# Patient Record
Sex: Female | Born: 1940 | Race: White | Hispanic: No | Marital: Single
Health system: Southern US, Community
[De-identification: ages and names within clinical notes are randomized; demographics above are authoritative.]

## PROBLEM LIST (undated history)

## (undated) DIAGNOSIS — E039 Hypothyroidism, unspecified: Secondary | ICD-10-CM

## (undated) DIAGNOSIS — R52 Pain, unspecified: Secondary | ICD-10-CM

## (undated) DIAGNOSIS — K219 Gastro-esophageal reflux disease without esophagitis: Secondary | ICD-10-CM

## (undated) DIAGNOSIS — M109 Gout, unspecified: Secondary | ICD-10-CM

## (undated) DIAGNOSIS — B953 Streptococcus pneumoniae as the cause of diseases classified elsewhere: Secondary | ICD-10-CM

## (undated) DIAGNOSIS — F3289 Other specified depressive episodes: Secondary | ICD-10-CM

## (undated) DIAGNOSIS — E569 Vitamin deficiency, unspecified: Secondary | ICD-10-CM

## (undated) DIAGNOSIS — K5900 Constipation, unspecified: Secondary | ICD-10-CM

## (undated) DIAGNOSIS — I10 Essential (primary) hypertension: Secondary | ICD-10-CM

## (undated) DIAGNOSIS — F329 Major depressive disorder, single episode, unspecified: Secondary | ICD-10-CM

## (undated) DIAGNOSIS — I2510 Atherosclerotic heart disease of native coronary artery without angina pectoris: Secondary | ICD-10-CM

## (undated) DIAGNOSIS — G608 Other hereditary and idiopathic neuropathies: Secondary | ICD-10-CM

## (undated) DIAGNOSIS — L989 Disorder of the skin and subcutaneous tissue, unspecified: Secondary | ICD-10-CM

## (undated) HISTORY — DX: Atherosclerotic heart disease of native coronary artery without angina pectoris: I25.10

## (undated) HISTORY — DX: Pain, unspecified: R52

## (undated) HISTORY — DX: Major depressive disorder, single episode, unspecified: F32.9

## (undated) HISTORY — DX: Gout, unspecified: M10.9

## (undated) HISTORY — DX: Vitamin deficiency, unspecified: E56.9

## (undated) HISTORY — DX: Streptococcus pneumoniae as the cause of diseases classified elsewhere: B95.3

## (undated) HISTORY — DX: Essential (primary) hypertension: I10

## (undated) HISTORY — DX: Other specified depressive episodes: F32.89

## (undated) HISTORY — DX: Other hereditary and idiopathic neuropathies: G60.8

## (undated) HISTORY — DX: Hypothyroidism, unspecified: E03.9

## (undated) HISTORY — DX: Disorder of the skin and subcutaneous tissue, unspecified: L98.9

## (undated) HISTORY — DX: Constipation, unspecified: K59.00

## (undated) HISTORY — DX: Gastro-esophageal reflux disease without esophagitis: K21.9

---

## 2005-01-09 ENCOUNTER — Emergency Department: Payer: Self-pay | Admitting: Emergency Medicine

## 2005-01-09 ENCOUNTER — Other Ambulatory Visit: Payer: Self-pay

## 2008-01-20 ENCOUNTER — Ambulatory Visit: Payer: Self-pay | Admitting: Nephrology

## 2008-02-22 ENCOUNTER — Observation Stay: Payer: Self-pay | Admitting: Internal Medicine

## 2008-03-13 ENCOUNTER — Ambulatory Visit: Payer: Self-pay | Admitting: Oncology

## 2008-03-20 ENCOUNTER — Ambulatory Visit: Payer: Self-pay | Admitting: Oncology

## 2008-04-13 ENCOUNTER — Ambulatory Visit: Payer: Self-pay | Admitting: Oncology

## 2008-06-28 ENCOUNTER — Ambulatory Visit: Payer: Self-pay | Admitting: Oncology

## 2008-07-13 ENCOUNTER — Ambulatory Visit: Payer: Self-pay | Admitting: Oncology

## 2008-08-09 ENCOUNTER — Ambulatory Visit: Payer: Self-pay | Admitting: Oncology

## 2008-08-13 ENCOUNTER — Ambulatory Visit: Payer: Self-pay | Admitting: Oncology

## 2010-01-16 ENCOUNTER — Ambulatory Visit: Payer: Self-pay | Admitting: Nephrology

## 2010-02-13 ENCOUNTER — Emergency Department: Payer: Self-pay | Admitting: Emergency Medicine

## 2010-05-23 ENCOUNTER — Inpatient Hospital Stay: Payer: Self-pay | Admitting: Internal Medicine

## 2010-06-24 ENCOUNTER — Ambulatory Visit: Payer: Self-pay | Admitting: Vascular Surgery

## 2010-07-10 ENCOUNTER — Ambulatory Visit: Payer: Self-pay | Admitting: Vascular Surgery

## 2010-07-18 ENCOUNTER — Ambulatory Visit: Payer: Self-pay | Admitting: Vascular Surgery

## 2010-07-21 ENCOUNTER — Inpatient Hospital Stay: Payer: Self-pay | Admitting: *Deleted

## 2010-08-08 ENCOUNTER — Ambulatory Visit: Payer: Self-pay | Admitting: Oncology

## 2010-08-14 ENCOUNTER — Ambulatory Visit: Payer: Self-pay | Admitting: Oncology

## 2010-09-14 ENCOUNTER — Ambulatory Visit: Payer: Self-pay | Admitting: Oncology

## 2010-10-21 ENCOUNTER — Ambulatory Visit: Payer: Self-pay | Admitting: Vascular Surgery

## 2010-10-24 ENCOUNTER — Ambulatory Visit: Payer: Self-pay | Admitting: Oncology

## 2010-11-21 ENCOUNTER — Ambulatory Visit: Payer: Self-pay | Admitting: Oncology

## 2010-12-07 ENCOUNTER — Emergency Department: Payer: Self-pay | Admitting: Emergency Medicine

## 2010-12-14 ENCOUNTER — Ambulatory Visit: Payer: Self-pay | Admitting: Oncology

## 2011-01-14 ENCOUNTER — Ambulatory Visit: Payer: Self-pay | Admitting: Oncology

## 2011-01-20 ENCOUNTER — Ambulatory Visit: Payer: Self-pay | Admitting: Oncology

## 2011-01-20 LAB — CANCER CENTER HEMOGLOBIN: HGB: 10.4 g/dL — ABNORMAL LOW (ref 12.0–16.0)

## 2011-02-14 ENCOUNTER — Ambulatory Visit: Payer: Self-pay | Admitting: Oncology

## 2011-04-14 ENCOUNTER — Emergency Department: Payer: Self-pay | Admitting: Emergency Medicine

## 2011-05-28 ENCOUNTER — Emergency Department: Payer: Self-pay | Admitting: Emergency Medicine

## 2011-05-28 LAB — BASIC METABOLIC PANEL
Anion Gap: 7 (ref 7–16)
BUN: 16 mg/dL (ref 7–18)
Chloride: 106 mmol/L (ref 98–107)
Creatinine: 3.01 mg/dL — ABNORMAL HIGH (ref 0.60–1.30)
Glucose: 98 mg/dL (ref 65–99)
Osmolality: 284 (ref 275–301)
Potassium: 4.2 mmol/L (ref 3.5–5.1)

## 2011-09-11 ENCOUNTER — Ambulatory Visit: Payer: Self-pay | Admitting: Family Medicine

## 2011-12-09 ENCOUNTER — Emergency Department: Payer: Self-pay | Admitting: Emergency Medicine

## 2012-04-28 ENCOUNTER — Inpatient Hospital Stay: Payer: Self-pay | Admitting: Internal Medicine

## 2012-04-28 LAB — CBC
HGB: 9.9 g/dL — ABNORMAL LOW (ref 12.0–16.0)
MCH: 34.3 pg — ABNORMAL HIGH (ref 26.0–34.0)
MCHC: 33 g/dL (ref 32.0–36.0)
MCV: 104 fL — ABNORMAL HIGH (ref 80–100)
Platelet: 279 10*3/uL (ref 150–440)
RBC: 2.89 10*6/uL — ABNORMAL LOW (ref 3.80–5.20)

## 2012-04-28 LAB — COMPREHENSIVE METABOLIC PANEL
Albumin: 3.3 g/dL — ABNORMAL LOW (ref 3.4–5.0)
Alkaline Phosphatase: 72 U/L (ref 50–136)
Bilirubin,Total: 0.7 mg/dL (ref 0.2–1.0)
Co2: 29 mmol/L (ref 21–32)
Creatinine: 4.89 mg/dL — ABNORMAL HIGH (ref 0.60–1.30)
EGFR (African American): 10 — ABNORMAL LOW
EGFR (Non-African Amer.): 8 — ABNORMAL LOW
Glucose: 100 mg/dL — ABNORMAL HIGH (ref 65–99)
Osmolality: 283 (ref 275–301)
Potassium: 3.4 mmol/L — ABNORMAL LOW (ref 3.5–5.1)
SGOT(AST): 17 U/L (ref 15–37)
Sodium: 140 mmol/L (ref 136–145)
Total Protein: 6.7 g/dL (ref 6.4–8.2)

## 2012-04-28 LAB — CK TOTAL AND CKMB (NOT AT ARMC)
CK, Total: 39 U/L (ref 21–215)
CK-MB: 0.7 ng/mL (ref 0.5–3.6)

## 2012-04-30 LAB — PHOSPHORUS: Phosphorus: 2.4 mg/dL — ABNORMAL LOW (ref 2.5–4.9)

## 2012-05-04 LAB — CULTURE, BLOOD (SINGLE)

## 2012-05-25 ENCOUNTER — Ambulatory Visit: Payer: Self-pay | Admitting: Ophthalmology

## 2012-05-25 LAB — HEMOGLOBIN: HGB: 10.4 g/dL — ABNORMAL LOW (ref 12.0–16.0)

## 2012-05-31 ENCOUNTER — Ambulatory Visit: Payer: Self-pay | Admitting: Ophthalmology

## 2012-06-27 ENCOUNTER — Observation Stay: Payer: Self-pay | Admitting: Internal Medicine

## 2012-06-27 LAB — URINALYSIS, COMPLETE
Bacteria: NONE SEEN
Bilirubin,UR: NEGATIVE
Ketone: NEGATIVE
Nitrite: NEGATIVE
Protein: 100

## 2012-06-27 LAB — PRO B NATRIURETIC PEPTIDE: B-Type Natriuretic Peptide: 12957 pg/mL — ABNORMAL HIGH (ref 0–125)

## 2012-06-27 LAB — CK TOTAL AND CKMB (NOT AT ARMC)
CK, Total: 41 U/L (ref 21–215)
CK-MB: 1.2 ng/mL (ref 0.5–3.6)

## 2012-06-27 LAB — COMPREHENSIVE METABOLIC PANEL
Albumin: 3.6 g/dL (ref 3.4–5.0)
Alkaline Phosphatase: 82 U/L (ref 50–136)
BUN: 42 mg/dL — ABNORMAL HIGH (ref 7–18)
Bilirubin,Total: 0.4 mg/dL (ref 0.2–1.0)
Calcium, Total: 8.5 mg/dL (ref 8.5–10.1)
EGFR (Non-African Amer.): 7 — ABNORMAL LOW
Glucose: 89 mg/dL (ref 65–99)
Potassium: 3.7 mmol/L (ref 3.5–5.1)
SGOT(AST): 30 U/L (ref 15–37)
SGPT (ALT): 19 U/L (ref 12–78)
Sodium: 138 mmol/L (ref 136–145)

## 2012-06-27 LAB — TROPONIN I
Troponin-I: <0.02 ng/mL
Troponin-I: <0.02 ng/mL

## 2012-06-27 LAB — CBC
HGB: 12 g/dL (ref 12.0–16.0)
MCH: 34.8 pg — ABNORMAL HIGH (ref 26.0–34.0)
MCHC: 33.2 g/dL (ref 32.0–36.0)
MCV: 105 fL — ABNORMAL HIGH (ref 80–100)
RBC: 3.46 10*6/uL — ABNORMAL LOW (ref 3.80–5.20)
RDW: 16.3 % — ABNORMAL HIGH (ref 11.5–14.5)

## 2012-06-28 LAB — CK TOTAL AND CKMB (NOT AT ARMC)
CK, Total: 48 U/L (ref 21–215)
CK-MB: 1.3 ng/mL (ref 0.5–3.6)

## 2012-06-28 LAB — RENAL FUNCTION PANEL
Anion Gap: 9 (ref 7–16)
BUN: 50 mg/dL — ABNORMAL HIGH (ref 7–18)
Co2: 28 mmol/L (ref 21–32)
Creatinine: 6.74 mg/dL — ABNORMAL HIGH (ref 0.60–1.30)
EGFR (African American): 6 — ABNORMAL LOW
Glucose: 107 mg/dL — ABNORMAL HIGH (ref 65–99)
Phosphorus: 7.7 mg/dL — ABNORMAL HIGH (ref 2.5–4.9)
Potassium: 3.6 mmol/L (ref 3.5–5.1)

## 2012-06-28 LAB — TROPONIN I: Troponin-I: <0.02 ng/mL

## 2012-07-01 ENCOUNTER — Ambulatory Visit: Payer: Self-pay | Admitting: Ophthalmology

## 2012-07-01 LAB — HEMOGLOBIN: HGB: 12.7 g/dL (ref 12.0–16.0)

## 2012-07-01 LAB — POTASSIUM: Potassium: 3.8 mmol/L (ref 3.5–5.1)

## 2012-07-05 ENCOUNTER — Ambulatory Visit: Payer: Self-pay | Admitting: Ophthalmology

## 2012-10-04 ENCOUNTER — Observation Stay: Payer: Self-pay | Admitting: Family Medicine

## 2012-10-04 LAB — CBC
MCH: 37 pg — ABNORMAL HIGH (ref 26.0–34.0)
MCHC: 34 g/dL (ref 32.0–36.0)
Platelet: 228 10*3/uL (ref 150–440)
WBC: 8.9 10*3/uL (ref 3.6–11.0)

## 2012-10-04 LAB — BASIC METABOLIC PANEL
Anion Gap: 7 (ref 7–16)
BUN: 41 mg/dL — ABNORMAL HIGH (ref 7–18)
Calcium, Total: 8.7 mg/dL (ref 8.5–10.1)
Co2: 26 mmol/L (ref 21–32)
EGFR (African American): 6 — ABNORMAL LOW
Glucose: 93 mg/dL (ref 65–99)
Potassium: 3.9 mmol/L (ref 3.5–5.1)
Sodium: 140 mmol/L (ref 136–145)

## 2012-10-04 LAB — PHOSPHORUS: Phosphorus: 6.5 mg/dL — ABNORMAL HIGH (ref 2.5–4.9)

## 2012-10-04 LAB — CK TOTAL AND CKMB (NOT AT ARMC)
CK-MB: 1.1 ng/mL (ref 0.5–3.6)
CK-MB: 1.1 ng/mL (ref 0.5–3.6)

## 2012-10-04 LAB — TROPONIN I: Troponin-I: 0.04 ng/mL

## 2012-10-05 DIAGNOSIS — R079 Chest pain, unspecified: Secondary | ICD-10-CM

## 2012-10-05 LAB — BASIC METABOLIC PANEL
Anion Gap: 6 — ABNORMAL LOW (ref 7–16)
Co2: 30 mmol/L (ref 21–32)
Creatinine: 3.17 mg/dL — ABNORMAL HIGH (ref 0.60–1.30)
EGFR (African American): 16 — ABNORMAL LOW
Potassium: 3.5 mmol/L (ref 3.5–5.1)
Sodium: 137 mmol/L (ref 136–145)

## 2012-10-05 LAB — CK TOTAL AND CKMB (NOT AT ARMC)
CK, Total: 38 U/L (ref 21–215)
CK-MB: 1 ng/mL (ref 0.5–3.6)

## 2013-02-04 ENCOUNTER — Emergency Department: Payer: Self-pay

## 2013-02-04 LAB — CBC WITH DIFFERENTIAL/PLATELET
BASOS ABS: 0.1 10*3/uL (ref 0.0–0.1)
Basophil %: 0.9 %
EOS ABS: 0.3 10*3/uL (ref 0.0–0.7)
EOS PCT: 3.5 %
HCT: 29.1 % — ABNORMAL LOW (ref 35.0–47.0)
HGB: 9.4 g/dL — ABNORMAL LOW (ref 12.0–16.0)
LYMPHS PCT: 18.2 %
Lymphocyte #: 1.5 10*3/uL (ref 1.0–3.6)
MCH: 33.9 pg (ref 26.0–34.0)
MCHC: 32.5 g/dL (ref 32.0–36.0)
MCV: 104 fL — AB (ref 80–100)
MONOS PCT: 4.7 %
Monocyte #: 0.4 x10 3/mm (ref 0.2–0.9)
NEUTROS ABS: 5.9 10*3/uL (ref 1.4–6.5)
Neutrophil %: 72.7 %
Platelet: 221 10*3/uL (ref 150–440)
RBC: 2.79 10*6/uL — ABNORMAL LOW (ref 3.80–5.20)
RDW: 17.2 % — ABNORMAL HIGH (ref 11.5–14.5)
WBC: 8.1 10*3/uL (ref 3.6–11.0)

## 2013-02-04 LAB — BASIC METABOLIC PANEL
Anion Gap: 5 — ABNORMAL LOW (ref 7–16)
BUN: 17 mg/dL (ref 7–18)
Calcium, Total: 8.1 mg/dL — ABNORMAL LOW (ref 8.5–10.1)
Chloride: 99 mmol/L (ref 98–107)
Co2: 31 mmol/L (ref 21–32)
Creatinine: 4.07 mg/dL — ABNORMAL HIGH (ref 0.60–1.30)
EGFR (African American): 12 — ABNORMAL LOW
GFR CALC NON AF AMER: 10 — AB
GLUCOSE: 118 mg/dL — AB (ref 65–99)
Osmolality: 273 (ref 275–301)
POTASSIUM: 2.9 mmol/L — AB (ref 3.5–5.1)
Sodium: 135 mmol/L — ABNORMAL LOW (ref 136–145)

## 2013-02-04 LAB — TROPONIN I: Troponin-I: <0.02 ng/mL

## 2013-02-15 ENCOUNTER — Inpatient Hospital Stay: Payer: Self-pay | Admitting: Internal Medicine

## 2013-02-15 LAB — BASIC METABOLIC PANEL
Anion Gap: 9 (ref 7–16)
BUN: 55 mg/dL — ABNORMAL HIGH (ref 7–18)
CHLORIDE: 98 mmol/L (ref 98–107)
CREATININE: 9.39 mg/dL — AB (ref 0.60–1.30)
Calcium, Total: 9 mg/dL (ref 8.5–10.1)
Co2: 24 mmol/L (ref 21–32)
EGFR (African American): 4 — ABNORMAL LOW
EGFR (Non-African Amer.): 4 — ABNORMAL LOW
Glucose: 115 mg/dL — ABNORMAL HIGH (ref 65–99)
OSMOLALITY: 279 (ref 275–301)
POTASSIUM: 4.2 mmol/L (ref 3.5–5.1)
Sodium: 131 mmol/L — ABNORMAL LOW (ref 136–145)

## 2013-02-15 LAB — TROPONIN I
TROPONIN-I: 0.08 ng/mL — AB
Troponin-I: 0.08 ng/mL — ABNORMAL HIGH
Troponin-I: 0.11 ng/mL — ABNORMAL HIGH

## 2013-02-15 LAB — PHOSPHORUS: Phosphorus: 6.7 mg/dL — ABNORMAL HIGH (ref 2.5–4.9)

## 2013-02-15 LAB — CBC
HCT: 25.7 % — AB (ref 35.0–47.0)
HGB: 8.3 g/dL — ABNORMAL LOW (ref 12.0–16.0)
MCH: 35.2 pg — ABNORMAL HIGH (ref 26.0–34.0)
MCHC: 32.4 g/dL (ref 32.0–36.0)
MCV: 108 fL — AB (ref 80–100)
Platelet: 258 10*3/uL (ref 150–440)
RBC: 2.37 10*6/uL — AB (ref 3.80–5.20)
RDW: 18.5 % — AB (ref 11.5–14.5)
WBC: 13.4 10*3/uL — ABNORMAL HIGH (ref 3.6–11.0)

## 2013-02-16 LAB — CBC WITH DIFFERENTIAL/PLATELET
Basophil #: 0 10*3/uL (ref 0.0–0.1)
Basophil %: 0.3 %
Eosinophil #: 0 10*3/uL (ref 0.0–0.7)
Eosinophil %: 0.1 %
HCT: 21.6 % — ABNORMAL LOW (ref 35.0–47.0)
HGB: 6.8 g/dL — ABNORMAL LOW (ref 12.0–16.0)
LYMPHS ABS: 1.4 10*3/uL (ref 1.0–3.6)
Lymphocyte %: 13.2 %
MCH: 34.3 pg — ABNORMAL HIGH (ref 26.0–34.0)
MCHC: 31.6 g/dL — AB (ref 32.0–36.0)
MCV: 109 fL — AB (ref 80–100)
Monocyte #: 1.1 x10 3/mm — ABNORMAL HIGH (ref 0.2–0.9)
Monocyte %: 10.2 %
Neutrophil #: 8.2 10*3/uL — ABNORMAL HIGH (ref 1.4–6.5)
Neutrophil %: 76.2 %
PLATELETS: 191 10*3/uL (ref 150–440)
RBC: 1.99 10*6/uL — ABNORMAL LOW (ref 3.80–5.20)
RDW: 18.4 % — ABNORMAL HIGH (ref 11.5–14.5)
WBC: 10.7 10*3/uL (ref 3.6–11.0)

## 2013-02-16 LAB — BASIC METABOLIC PANEL
Anion Gap: 9 (ref 7–16)
BUN: 18 mg/dL (ref 7–18)
CALCIUM: 8.6 mg/dL (ref 8.5–10.1)
Chloride: 96 mmol/L — ABNORMAL LOW (ref 98–107)
Co2: 32 mmol/L (ref 21–32)
Creatinine: 4.28 mg/dL — ABNORMAL HIGH (ref 0.60–1.30)
EGFR (Non-African Amer.): 10 — ABNORMAL LOW
GFR CALC AF AMER: 11 — AB
GLUCOSE: 72 mg/dL (ref 65–99)
OSMOLALITY: 274 (ref 275–301)
Potassium: 3.5 mmol/L (ref 3.5–5.1)
Sodium: 137 mmol/L (ref 136–145)

## 2013-02-16 LAB — HEMOGLOBIN: HGB: 8.4 g/dL — ABNORMAL LOW (ref 12.0–16.0)

## 2013-02-16 LAB — PHOSPHORUS: PHOSPHORUS: 3.8 mg/dL (ref 2.5–4.9)

## 2013-02-16 LAB — MAGNESIUM: Magnesium: 1.9 mg/dL

## 2013-02-17 LAB — CBC WITH DIFFERENTIAL/PLATELET
Basophil #: 0 10*3/uL (ref 0.0–0.1)
Basophil %: 0.4 %
EOS ABS: 0.1 10*3/uL (ref 0.0–0.7)
EOS PCT: 1 %
HCT: 25.2 % — AB (ref 35.0–47.0)
HGB: 8.4 g/dL — ABNORMAL LOW (ref 12.0–16.0)
LYMPHS PCT: 6.8 %
Lymphocyte #: 0.7 10*3/uL — ABNORMAL LOW (ref 1.0–3.6)
MCH: 35.7 pg — ABNORMAL HIGH (ref 26.0–34.0)
MCHC: 33.5 g/dL (ref 32.0–36.0)
MCV: 107 fL — ABNORMAL HIGH (ref 80–100)
Monocyte #: 1.2 x10 3/mm — ABNORMAL HIGH (ref 0.2–0.9)
Monocyte %: 11.1 %
NEUTROS PCT: 80.7 %
Neutrophil #: 8.8 10*3/uL — ABNORMAL HIGH (ref 1.4–6.5)
PLATELETS: 211 10*3/uL (ref 150–440)
RBC: 2.37 10*6/uL — ABNORMAL LOW (ref 3.80–5.20)
RDW: 19.4 % — ABNORMAL HIGH (ref 11.5–14.5)
WBC: 10.9 10*3/uL (ref 3.6–11.0)

## 2013-02-18 LAB — CBC WITH DIFFERENTIAL/PLATELET
Basophil #: 0.1 10*3/uL (ref 0.0–0.1)
Basophil %: 0.5 %
EOS ABS: 0.2 10*3/uL (ref 0.0–0.7)
Eosinophil %: 1.4 %
HCT: 26.1 % — ABNORMAL LOW (ref 35.0–47.0)
HGB: 8.7 g/dL — ABNORMAL LOW (ref 12.0–16.0)
Lymphocyte #: 0.8 10*3/uL — ABNORMAL LOW (ref 1.0–3.6)
Lymphocyte %: 7.2 %
MCH: 35.8 pg — AB (ref 26.0–34.0)
MCHC: 33.5 g/dL (ref 32.0–36.0)
MCV: 107 fL — AB (ref 80–100)
MONOS PCT: 10.6 %
Monocyte #: 1.1 x10 3/mm — ABNORMAL HIGH (ref 0.2–0.9)
Neutrophil #: 8.6 10*3/uL — ABNORMAL HIGH (ref 1.4–6.5)
Neutrophil %: 80.3 %
Platelet: 226 10*3/uL (ref 150–440)
RBC: 2.44 10*6/uL — ABNORMAL LOW (ref 3.80–5.20)
RDW: 18.5 % — ABNORMAL HIGH (ref 11.5–14.5)
WBC: 10.7 10*3/uL (ref 3.6–11.0)

## 2013-02-18 LAB — PHOSPHORUS: Phosphorus: 4.7 mg/dL (ref 2.5–4.9)

## 2013-02-20 LAB — CULTURE, BLOOD (SINGLE)

## 2013-03-01 ENCOUNTER — Encounter: Payer: Self-pay | Admitting: Podiatry

## 2013-03-11 ENCOUNTER — Ambulatory Visit: Payer: Self-pay | Admitting: Podiatry

## 2013-03-22 ENCOUNTER — Ambulatory Visit: Payer: Self-pay | Admitting: Podiatry

## 2013-03-28 ENCOUNTER — Inpatient Hospital Stay: Payer: Self-pay | Admitting: Internal Medicine

## 2013-03-28 LAB — BASIC METABOLIC PANEL
ANION GAP: 8 (ref 7–16)
BUN: 14 mg/dL (ref 7–18)
CO2: 30 mmol/L (ref 21–32)
Calcium, Total: 8.5 mg/dL (ref 8.5–10.1)
Chloride: 100 mmol/L (ref 98–107)
Creatinine: 3.42 mg/dL — ABNORMAL HIGH (ref 0.60–1.30)
EGFR (Non-African Amer.): 13 — ABNORMAL LOW
GFR CALC AF AMER: 15 — AB
Glucose: 79 mg/dL (ref 65–99)
Osmolality: 275 (ref 275–301)
Potassium: 3 mmol/L — ABNORMAL LOW (ref 3.5–5.1)
Sodium: 138 mmol/L (ref 136–145)

## 2013-03-28 LAB — TROPONIN I
TROPONIN-I: 0.13 ng/mL — AB
TROPONIN-I: 0.15 ng/mL — AB
Troponin-I: 0.14 ng/mL — ABNORMAL HIGH

## 2013-03-28 LAB — CBC
HCT: 31.7 % — AB (ref 35.0–47.0)
HGB: 9.8 g/dL — ABNORMAL LOW (ref 12.0–16.0)
MCH: 32.6 pg (ref 26.0–34.0)
MCHC: 30.9 g/dL — ABNORMAL LOW (ref 32.0–36.0)
MCV: 106 fL — AB (ref 80–100)
Platelet: 207 10*3/uL (ref 150–440)
RBC: 3.01 10*6/uL — AB (ref 3.80–5.20)
RDW: 17 % — AB (ref 11.5–14.5)
WBC: 7.1 10*3/uL (ref 3.6–11.0)

## 2013-03-28 LAB — CK-MB
CK-MB: 1.1 ng/mL (ref 0.5–3.6)
CK-MB: 1.3 ng/mL (ref 0.5–3.6)

## 2013-03-29 LAB — CBC WITH DIFFERENTIAL/PLATELET
BASOS PCT: 0.8 %
Basophil #: 0 10*3/uL (ref 0.0–0.1)
EOS ABS: 0.2 10*3/uL (ref 0.0–0.7)
Eosinophil %: 3 %
HCT: 28.6 % — AB (ref 35.0–47.0)
HGB: 9 g/dL — AB (ref 12.0–16.0)
LYMPHS ABS: 1.4 10*3/uL (ref 1.0–3.6)
Lymphocyte %: 20.5 %
MCH: 33.1 pg (ref 26.0–34.0)
MCHC: 31.4 g/dL — AB (ref 32.0–36.0)
MCV: 106 fL — AB (ref 80–100)
MONO ABS: 0.5 x10 3/mm (ref 0.2–0.9)
Monocyte %: 7.4 %
NEUTROS PCT: 68.3 %
Neutrophil #: 4.5 10*3/uL (ref 1.4–6.5)
Platelet: 170 10*3/uL (ref 150–440)
RBC: 2.71 10*6/uL — AB (ref 3.80–5.20)
RDW: 17.2 % — ABNORMAL HIGH (ref 11.5–14.5)
WBC: 6.6 10*3/uL (ref 3.6–11.0)

## 2013-03-29 LAB — BASIC METABOLIC PANEL
Anion Gap: 4 — ABNORMAL LOW (ref 7–16)
BUN: 19 mg/dL — AB (ref 7–18)
CALCIUM: 7.8 mg/dL — AB (ref 8.5–10.1)
CHLORIDE: 101 mmol/L (ref 98–107)
CO2: 30 mmol/L (ref 21–32)
Creatinine: 4.64 mg/dL — ABNORMAL HIGH (ref 0.60–1.30)
EGFR (African American): 10 — ABNORMAL LOW
GFR CALC NON AF AMER: 9 — AB
Glucose: 68 mg/dL (ref 65–99)
OSMOLALITY: 271 (ref 275–301)
Potassium: 3.2 mmol/L — ABNORMAL LOW (ref 3.5–5.1)
Sodium: 135 mmol/L — ABNORMAL LOW (ref 136–145)

## 2013-03-29 LAB — PHOSPHORUS: Phosphorus: 3.8 mg/dL (ref 2.5–4.9)

## 2013-03-29 LAB — TSH: Thyroid Stimulating Horm: 16.6 u[IU]/mL — ABNORMAL HIGH

## 2013-03-30 LAB — PHOSPHORUS: Phosphorus: 2 mg/dL — ABNORMAL LOW (ref 2.5–4.9)

## 2013-03-30 LAB — T4, FREE: Free Thyroxine: 1.36 ng/dL (ref 0.76–1.46)

## 2013-03-31 LAB — BASIC METABOLIC PANEL
ANION GAP: 5 — AB (ref 7–16)
BUN: 6 mg/dL — ABNORMAL LOW (ref 7–18)
Calcium, Total: 8.7 mg/dL (ref 8.5–10.1)
Chloride: 100 mmol/L (ref 98–107)
Co2: 34 mmol/L — ABNORMAL HIGH (ref 21–32)
Creatinine: 2.49 mg/dL — ABNORMAL HIGH (ref 0.60–1.30)
EGFR (African American): 21 — ABNORMAL LOW
GFR CALC NON AF AMER: 19 — AB
GLUCOSE: 79 mg/dL (ref 65–99)
Osmolality: 274 (ref 275–301)
POTASSIUM: 3.1 mmol/L — AB (ref 3.5–5.1)
SODIUM: 139 mmol/L (ref 136–145)

## 2013-04-01 LAB — BASIC METABOLIC PANEL
Anion Gap: 3 — ABNORMAL LOW (ref 7–16)
BUN: 14 mg/dL (ref 7–18)
CREATININE: 4.37 mg/dL — AB (ref 0.60–1.30)
Calcium, Total: 8.6 mg/dL (ref 8.5–10.1)
Chloride: 101 mmol/L (ref 98–107)
Co2: 35 mmol/L — ABNORMAL HIGH (ref 21–32)
EGFR (African American): 11 — ABNORMAL LOW
GFR CALC NON AF AMER: 9 — AB
GLUCOSE: 86 mg/dL (ref 65–99)
Osmolality: 277 (ref 275–301)
Potassium: 3.4 mmol/L — ABNORMAL LOW (ref 3.5–5.1)
Sodium: 139 mmol/L (ref 136–145)

## 2013-04-01 LAB — PHOSPHORUS: Phosphorus: 2.2 mg/dL — ABNORMAL LOW (ref 2.5–4.9)

## 2013-04-02 LAB — CULTURE, BLOOD (SINGLE)

## 2013-04-05 ENCOUNTER — Ambulatory Visit: Payer: Self-pay | Admitting: Podiatry

## 2013-05-03 ENCOUNTER — Inpatient Hospital Stay: Payer: Self-pay | Admitting: Internal Medicine

## 2013-05-03 LAB — URINALYSIS, COMPLETE
Bacteria: NONE SEEN
Bilirubin,UR: NEGATIVE
KETONE: NEGATIVE
LEUKOCYTE ESTERASE: NEGATIVE
Nitrite: NEGATIVE
Ph: 7 (ref 4.5–8.0)
Protein: >=500
RBC,UR: 25 /HPF (ref 0–5)
SPECIFIC GRAVITY: 1.01 (ref 1.003–1.030)
Squamous Epithelial: <1
WBC UR: 2 /HPF (ref 0–5)

## 2013-05-03 LAB — PROTIME-INR
INR: 1
Prothrombin Time: 13.1 secs (ref 11.5–14.7)

## 2013-05-03 LAB — COMPREHENSIVE METABOLIC PANEL
ALK PHOS: 108 U/L
ANION GAP: 9 (ref 7–16)
AST: 20 U/L (ref 15–37)
Albumin: 2.9 g/dL — ABNORMAL LOW (ref 3.4–5.0)
BILIRUBIN TOTAL: 0.4 mg/dL (ref 0.2–1.0)
BUN: 43 mg/dL — ABNORMAL HIGH (ref 7–18)
CALCIUM: 8.5 mg/dL (ref 8.5–10.1)
CHLORIDE: 102 mmol/L (ref 98–107)
Co2: 30 mmol/L (ref 21–32)
Creatinine: 5.21 mg/dL — ABNORMAL HIGH (ref 0.60–1.30)
EGFR (African American): 9 — ABNORMAL LOW
GFR CALC NON AF AMER: 8 — AB
Glucose: 100 mg/dL — ABNORMAL HIGH (ref 65–99)
Osmolality: 292 (ref 275–301)
POTASSIUM: 4 mmol/L (ref 3.5–5.1)
SGPT (ALT): 16 U/L (ref 12–78)
SODIUM: 141 mmol/L (ref 136–145)
Total Protein: 6.2 g/dL — ABNORMAL LOW (ref 6.4–8.2)

## 2013-05-03 LAB — CBC
HCT: 32.7 % — ABNORMAL LOW
HGB: 10.3 g/dL — ABNORMAL LOW
MCH: 33.1 pg
MCHC: 31.6 g/dL — ABNORMAL LOW
MCV: 105 fL — ABNORMAL HIGH
Platelet: 242 10*3/uL
RBC: 3.12 X10 6/mm 3 — ABNORMAL LOW
RDW: 17.2 % — ABNORMAL HIGH
WBC: 10.7 10*3/uL

## 2013-05-04 LAB — MAGNESIUM: Magnesium: 1.8 mg/dL

## 2013-05-04 LAB — CBC WITH DIFFERENTIAL/PLATELET
BASOS PCT: 0.9 %
Basophil #: 0.1 10*3/uL (ref 0.0–0.1)
Eosinophil #: 0.4 10*3/uL (ref 0.0–0.7)
Eosinophil %: 5.2 %
HCT: 30.6 % — AB (ref 35.0–47.0)
HGB: 9.8 g/dL — ABNORMAL LOW (ref 12.0–16.0)
LYMPHS ABS: 1.6 10*3/uL (ref 1.0–3.6)
LYMPHS PCT: 22.1 %
MCH: 33.8 pg (ref 26.0–34.0)
MCHC: 31.9 g/dL — ABNORMAL LOW (ref 32.0–36.0)
MCV: 106 fL — ABNORMAL HIGH (ref 80–100)
MONO ABS: 0.6 x10 3/mm (ref 0.2–0.9)
Monocyte %: 9.1 %
Neutrophil #: 4.4 10*3/uL (ref 1.4–6.5)
Neutrophil %: 62.7 %
Platelet: 193 10*3/uL (ref 150–440)
RBC: 2.89 10*6/uL — ABNORMAL LOW (ref 3.80–5.20)
RDW: 17.3 % — ABNORMAL HIGH (ref 11.5–14.5)
WBC: 7.1 10*3/uL (ref 3.6–11.0)

## 2013-05-04 LAB — BASIC METABOLIC PANEL
Anion Gap: 5 — ABNORMAL LOW (ref 7–16)
BUN: 49 mg/dL — ABNORMAL HIGH (ref 7–18)
CHLORIDE: 103 mmol/L (ref 98–107)
CREATININE: 5.91 mg/dL — AB (ref 0.60–1.30)
Calcium, Total: 8.4 mg/dL — ABNORMAL LOW (ref 8.5–10.1)
Co2: 29 mmol/L (ref 21–32)
EGFR (African American): 8 — ABNORMAL LOW
EGFR (Non-African Amer.): 7 — ABNORMAL LOW
Glucose: 88 mg/dL (ref 65–99)
Osmolality: 286 (ref 275–301)
Potassium: 4.2 mmol/L (ref 3.5–5.1)
SODIUM: 137 mmol/L (ref 136–145)

## 2013-12-16 ENCOUNTER — Emergency Department: Payer: Self-pay | Admitting: Emergency Medicine

## 2013-12-16 LAB — CBC WITH DIFFERENTIAL/PLATELET
BASOS ABS: 0.1 10*3/uL (ref 0.0–0.1)
Basophil %: 1.1 %
Eosinophil #: 0.5 10*3/uL (ref 0.0–0.7)
Eosinophil %: 6.2 %
HCT: 36.7 % (ref 35.0–47.0)
HGB: 11.5 g/dL — AB (ref 12.0–16.0)
LYMPHS ABS: 1.4 10*3/uL (ref 1.0–3.6)
Lymphocyte %: 17.3 %
MCH: 30.9 pg (ref 26.0–34.0)
MCHC: 31.3 g/dL — AB (ref 32.0–36.0)
MCV: 99 fL (ref 80–100)
Monocyte #: 0.7 x10 3/mm (ref 0.2–0.9)
Monocyte %: 8.3 %
Neutrophil #: 5.4 10*3/uL (ref 1.4–6.5)
Neutrophil %: 67.1 %
PLATELETS: 275 10*3/uL (ref 150–440)
RBC: 3.72 10*6/uL — ABNORMAL LOW (ref 3.80–5.20)
RDW: 20 % — ABNORMAL HIGH (ref 11.5–14.5)
WBC: 8.1 10*3/uL (ref 3.6–11.0)

## 2013-12-16 LAB — TROPONIN I: TROPONIN-I: 0.04 ng/mL

## 2013-12-16 LAB — BASIC METABOLIC PANEL
ANION GAP: 11 (ref 7–16)
BUN: 21 mg/dL — AB (ref 7–18)
Calcium, Total: 8.4 mg/dL — ABNORMAL LOW (ref 8.5–10.1)
Chloride: 100 mmol/L (ref 98–107)
Co2: 28 mmol/L (ref 21–32)
Creatinine: 5.75 mg/dL — ABNORMAL HIGH (ref 0.60–1.30)
EGFR (African American): 9 — ABNORMAL LOW
GFR CALC NON AF AMER: 8 — AB
GLUCOSE: 118 mg/dL — AB (ref 65–99)
Osmolality: 282 (ref 275–301)
Potassium: 3.4 mmol/L — ABNORMAL LOW (ref 3.5–5.1)
Sodium: 139 mmol/L (ref 136–145)

## 2014-01-13 ENCOUNTER — Ambulatory Visit: Payer: Self-pay | Admitting: Internal Medicine

## 2014-02-01 ENCOUNTER — Ambulatory Visit: Payer: Self-pay | Admitting: Family Medicine

## 2014-02-01 LAB — CBC WITH DIFFERENTIAL/PLATELET
BASOS ABS: 0.1 10*3/uL (ref 0.0–0.1)
BASOS PCT: 1 %
EOS ABS: 0.4 10*3/uL (ref 0.0–0.7)
Eosinophil %: 7.4 %
HCT: 33.7 % — ABNORMAL LOW (ref 35.0–47.0)
HGB: 10.9 g/dL — ABNORMAL LOW (ref 12.0–16.0)
LYMPHS ABS: 1.6 10*3/uL (ref 1.0–3.6)
LYMPHS PCT: 28.8 %
MCH: 32 pg (ref 26.0–34.0)
MCHC: 32.4 g/dL (ref 32.0–36.0)
MCV: 99 fL (ref 80–100)
Monocyte #: 0.6 x10 3/mm (ref 0.2–0.9)
Monocyte %: 11.4 %
Neutrophil #: 2.8 10*3/uL (ref 1.4–6.5)
Neutrophil %: 51.4 %
Platelet: 179 10*3/uL (ref 150–440)
RBC: 3.41 10*6/uL — ABNORMAL LOW (ref 3.80–5.20)
RDW: 16.8 % — AB (ref 11.5–14.5)
WBC: 5.4 10*3/uL (ref 3.6–11.0)

## 2014-02-01 LAB — URINALYSIS, COMPLETE
BILIRUBIN, UR: NEGATIVE
Bacteria: NONE SEEN
Glucose,UR: NEGATIVE mg/dL (ref 0–75)
Hyaline Cast: 3
KETONE: NEGATIVE
Nitrite: NEGATIVE
Ph: 8 (ref 4.5–8.0)
RBC,UR: 9 /HPF (ref 0–5)
Specific Gravity: 1.012 (ref 1.003–1.030)
WBC UR: 155 /HPF (ref 0–5)

## 2014-02-01 LAB — COMPREHENSIVE METABOLIC PANEL
ALBUMIN: 2.9 g/dL — AB (ref 3.4–5.0)
Alkaline Phosphatase: 100 U/L
Anion Gap: 7 (ref 7–16)
BUN: 48 mg/dL — AB (ref 7–18)
Bilirubin,Total: 0.3 mg/dL (ref 0.2–1.0)
CALCIUM: 8.7 mg/dL (ref 8.5–10.1)
CHLORIDE: 102 mmol/L (ref 98–107)
Co2: 33 mmol/L — ABNORMAL HIGH (ref 21–32)
Creatinine: 6.58 mg/dL — ABNORMAL HIGH (ref 0.60–1.30)
EGFR (African American): 8 — ABNORMAL LOW
GFR CALC NON AF AMER: 7 — AB
GLUCOSE: 97 mg/dL (ref 65–99)
OSMOLALITY: 296 (ref 275–301)
Potassium: 5 mmol/L (ref 3.5–5.1)
SGOT(AST): 22 U/L (ref 15–37)
SGPT (ALT): 21 U/L
SODIUM: 142 mmol/L (ref 136–145)
Total Protein: 6 g/dL — ABNORMAL LOW (ref 6.4–8.2)

## 2014-02-02 ENCOUNTER — Inpatient Hospital Stay: Payer: Self-pay | Admitting: Internal Medicine

## 2014-02-02 LAB — CBC WITH DIFFERENTIAL/PLATELET
Basophil #: 0.1 10*3/uL (ref 0.0–0.1)
Basophil %: 0.7 %
Eosinophil #: 0.2 10*3/uL (ref 0.0–0.7)
Eosinophil %: 2.3 %
HCT: 38 % (ref 35.0–47.0)
HGB: 11.9 g/dL — ABNORMAL LOW (ref 12.0–16.0)
Lymphocyte #: 1.3 10*3/uL (ref 1.0–3.6)
Lymphocyte %: 14.2 %
MCH: 31.5 pg (ref 26.0–34.0)
MCHC: 31.4 g/dL — AB (ref 32.0–36.0)
MCV: 101 fL — ABNORMAL HIGH (ref 80–100)
MONO ABS: 0.6 x10 3/mm (ref 0.2–0.9)
MONOS PCT: 6.8 %
NEUTROS ABS: 6.8 10*3/uL — AB (ref 1.4–6.5)
Neutrophil %: 76 %
PLATELETS: 212 10*3/uL (ref 150–440)
RBC: 3.78 10*6/uL — ABNORMAL LOW (ref 3.80–5.20)
RDW: 17.2 % — ABNORMAL HIGH (ref 11.5–14.5)
WBC: 8.9 10*3/uL (ref 3.6–11.0)

## 2014-02-02 LAB — URINALYSIS, COMPLETE
Bilirubin,UR: NEGATIVE
GLUCOSE, UR: NEGATIVE mg/dL (ref 0–75)
KETONE: NEGATIVE
Nitrite: NEGATIVE
PH: 7 (ref 4.5–8.0)
Protein: 100
RBC,UR: 235 /HPF (ref 0–5)
SPECIFIC GRAVITY: 1.016 (ref 1.003–1.030)
WBC UR: 14870 /HPF (ref 0–5)

## 2014-02-02 LAB — COMPREHENSIVE METABOLIC PANEL
ALK PHOS: 117 U/L — AB
Albumin: 3.1 g/dL — ABNORMAL LOW (ref 3.4–5.0)
Anion Gap: 10 (ref 7–16)
BUN: 60 mg/dL — AB (ref 7–18)
Bilirubin,Total: 0.4 mg/dL (ref 0.2–1.0)
CO2: 28 mmol/L (ref 21–32)
Calcium, Total: 8.9 mg/dL (ref 8.5–10.1)
Chloride: 103 mmol/L (ref 98–107)
Creatinine: 8.09 mg/dL — ABNORMAL HIGH (ref 0.60–1.30)
EGFR (African American): 6 — ABNORMAL LOW
GFR CALC NON AF AMER: 5 — AB
GLUCOSE: 95 mg/dL (ref 65–99)
Osmolality: 298 (ref 275–301)
Potassium: 5.7 mmol/L — ABNORMAL HIGH (ref 3.5–5.1)
SGOT(AST): 46 U/L — ABNORMAL HIGH (ref 15–37)
SGPT (ALT): 25 U/L
Sodium: 141 mmol/L (ref 136–145)
TOTAL PROTEIN: 6.6 g/dL (ref 6.4–8.2)

## 2014-02-02 LAB — TROPONIN I: Troponin-I: 0.06 ng/mL — ABNORMAL HIGH

## 2014-02-02 LAB — MAGNESIUM: Magnesium: 2.3 mg/dL

## 2014-02-02 LAB — LIPASE, BLOOD: Lipase: 111 U/L (ref 73–393)

## 2014-02-03 LAB — URINE CULTURE

## 2014-02-04 LAB — URINE CULTURE

## 2014-02-07 LAB — CULTURE, BLOOD (SINGLE)

## 2014-02-13 ENCOUNTER — Ambulatory Visit: Payer: Self-pay | Admitting: Internal Medicine

## 2014-02-13 DEATH — deceased

## 2014-05-05 NOTE — Discharge Summary (Signed)
PATIENT NAME:  Jasmine Murphy, BENCH MR#:  [id number] DATE OF BIRTH:  12/15/1940  DATE OF ADMISSION:  10/04/2012 DATE OF DISCHARGE:  10/05/2012  REASON FOR ADMISSION: Chest pain.   DISCHARGE DIAGNOSES: 1.  Atypical chest pain likely secondary to either gastroesophageal reflux disease or musculoskeletal.  2.  End-stage renal disease.  3.  History of prior cerebrovascular accident.  4.  History of gout.  5.  Migraine headaches.   DISPOSITION: Home.   FOLLOWUPS: Referral to see [facility] as the patient had seen them before. She would like to get established there as she does not have a primary care physician. Her primary care physician is actually her kidney doctor, Dr. Thedore Mins.   MEDICATIONS AT DISCHARGE:  1.  Aspirin 81 mg daily.  2.  Nifedipine 60 mg take 1/2 tablet once a day.  3.  Gabapentin 300 mg 3 times daily.  4.  Labetalol 200 mg once a day.  5.  Citalopram 20 mg once a day.  6.  Levothyroxine 112 mcg once a day.  7.  Omeprazole 40 mg once a day.  8.  Furosemide 80 mg once a day. 9.  Losartan 100 mg at bedtime.  10.  Vitamin D3, 2000 units once a day.   LABORATORY AND DIAGNOSTIC RESULTS:   A nuclear stress test Lexiscan shows no significant ischemia, no significant wall motion abnormalities, ejection fraction 56%.  Left ventricular global function was normal. No concerns for ischemia on the EKG. No artifact. This is a low-risk scan.  Her creatinine was ok . Her calcium is 3.3. Other electrolytes are within normal limits. Her hemoglobin is 9.7, which is chronic disease anemia due to chronic kidney disease, end-stage renal disease. White count was 9.8, platelet count 228. Troponins were 0.02 on admission, 0.04, 0.04 second and third, respectively.  Chest x-ray did not show any signs of CHF, fluid overload or pneumonia.   HOSPITAL COURSE: A 74 year old female with history of hypertension, end-stage renal disease, CVA, came with midsternal chest pain radiating into both  shoulders that happened at rest in the middle of the night and woke her up. She had 1-1/2 hours where she was complaining of this pain and it then went away. She had nausea and she had shortness of breath.  Again, the pain went away, but in the morning she was concerned and she came to the ER. She has received 1 aspirin on the EMS, and the pain was gone during the time of this hospitalization. The patient was admitted for evaluation of this.  Her chest pain, since she had significant factors with cardiac enzymes that were not significantly changing, she had a chest x-ray that did not show any major abnormalities.  She had a stress test that was low risk. She had a couple of episodes of decreased oxygen saturation in the 90s and increased heart rate of 93. There was no major concern of PE as she was not having any significant pain anymore and the hypoxia was minimal. The patient underwent her testing, did not have any more pain during her hospitalization. We had a long talk about the evaluation of the chest pain that could be related to GERD. The patient was not taking her PPI medication on a regular basis. We recommended to have her now take it daily. The patient also was concerned about this having a heart attack, so we recommended followup with [facility] as she has been seen before by then. Her blood pressure was  stable. The patient is scheduled to continue her hemodialysis on the same Monday, Wednesday and Friday days. The patient is discharged in good condition.   TIME SPENT:   About 40 minutes with this discharge.   ____________________________ Felipa Furnaceoberto Sanchez Gutierrez, MD rsg:cb D: 10/06/2012 14:18:37 ET T: 10/06/2012 17:55:02 ET JOB#: [id number]  cc: Felipa Furnaceoberto Sanchez Gutierrez, MD, <Dictator> Mosetta PigeonHarmeet Singh, MD Laurier NancyShaukat A. Khan, MD Regan RakersOBERTO Juanda ChanceSANCHEZ GUTIERRE MD ELECTRONICALLY SIGNED 10/07/2012 11:07

## 2014-05-05 NOTE — Op Note (Signed)
PATIENT NAME:  Jasmine Murphy, Jasmine Murphy MR#:  [id number] DATE OF BIRTH:  09-29-1940  DATE OF PROCEDURE:  05/31/2012  PREOPERATIVE DIAGNOSIS:  Cataract, left eye.    POSTOPERATIVE DIAGNOSIS:  Cataract, left eye.  PROCEDURE PERFORMED:  Extracapsular cataract extraction using phacoemulsification with placement of an Alcon SN6CWS, 20.5-diopter posterior chamber lens, serial #[id number].  SURGEON:  Maylon PeppersSteven A. Imojean Yoshino, MD  ASSISTANT:  None.  ANESTHESIA:  4% lidocaine and 0.75% Marcaine in a 50/50 mixture with 10 units/mL of Hylenex added, given as a peribulbar.   ANESTHESIOLOGIST:  Dr. Dimple Caseyice.  COMPLICATIONS:  None.  ESTIMATED BLOOD LOSS:  Less than 1 ml.  DESCRIPTION OF PROCEDURE:  The patient was brought to the operating room and given a peribulbar block.  The patient was then prepped and draped in the usual fashion.  The vertical rectus muscles were imbricated using 5-0 silk sutures.  These sutures were then clamped to the sterile drapes as bridle sutures.  A limbal peritomy was performed extending two clock hours and hemostasis was obtained with cautery.  A partial thickness scleral groove was made at the surgical limbus and dissected anteriorly in a lamellar dissection using an Alcon crescent knife.  The anterior chamber was entered supero-temporally with a Superblade and through the lamellar dissection with a 2.6 mm keratome.  DisCoVisc was used to replace the aqueous and a continuous tear capsulorrhexis was carried out.  Hydrodissection and hydrodelineation were carried out with balanced salt and a 27 gauge canula.  The nucleus was rotated to confirm the effectiveness of the hydrodissection.  Phacoemulsification was carried out using a divide-and-conquer technique.  Total ultrasound time was 2 minutes and 5 seconds with an average power of 25.7 percent and CDE of 56.83.  Irrigation/aspiration was used to remove the residual cortex.  DisCoVisc was used to inflate the capsule and the internal  incision was enlarged to 3 mm with the crescent knife.  The intraocular lens was folded and inserted into the capsular bag using the AcrySert delivery system. Irrigation/aspiration was used to remove the residual DisCoVisc.  Miostat was injected into the anterior chamber through the paracentesis track to inflate the anterior chamber and induce miosis.  The wound was checked for leaks and none were found. The conjunctiva was closed with cautery and the bridle sutures were removed.  Two drops of 0.3% Vigamox were placed on the eye.   An eye shield was placed on the eye.  The patient was discharged to the recovery room in good condition. ____________________________ Maylon PeppersSteven A. Brick Ketcher, MD sad:sb D: 05/31/2012 14:08:35 ET T: 05/31/2012 15:45:29 ET JOB#: [id number]  cc: Viviann SpareSteven A. Tenicia Gural, MD, <Dictator> Erline LevineSTEVEN A Desi Rowe MD ELECTRONICALLY SIGNED 06/14/2012 13:56

## 2014-05-05 NOTE — H&P (Signed)
PATIENT NAME:  Jasmine Murphy, Jasmine Murphy MR#:  [id number] DATE OF BIRTH:  Aug 21, 1940  DATE OF ADMISSION:  04/28/2012  ADMITTING PHYSICIAN: Enid Baasadhika Malkie Wille, MD  PRIMARY CARE PHYSICIAN: Nira ConnAriana Pancaldo, MD  PRIMARY NEPHROLOGIST: Lennox PippinsMunsoor N. Lateef, MD  CHIEF COMPLAINT: Difficulty breathing.   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 The patient had her last dialysis done on Monday and is due for dialysis today. She also had pneumonia 2 weeks ago, for which she was started on an antibiotic, which she did not finish off the course as she felt better. She has been having worsening dry cough, no sputum production, no fevers or chills at this time. Per Dr. Cherylann RatelLateef, the patient is sometimes noncompliant with her medications and diet. Chest x-ray in the ER revealed pulmonary edema and her blood pressure systolic was greater than 200, so she is being admitted for pulmonary edema and also malignant hypertension.   PAST MEDICAL HISTORY:  1.  History of TIA.  2.  History of intracerebral hemorrhage.  3.  Uncontrolled hypertension.  4.  Gout.  5.  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  6.  Secondary hyperparathyroidism. 7.  Hyperlipidemia.  8.  Anemia of chronic disease.   PAST SURGICAL HISTORY:  1.  Partial hysterectomy.  2.  Cholecystectomy.  3.  AV fistula placement on right forearm.   ALLERGIES TO MEDICATIONS: THE PATIENT IS ALLERGIC TO MULTIPLE MEDICATIONS, INCLUDING CODEINE, COREG, HYDRALAZINE, NORVASC, PENICILLIN, VERAPAMIL.   HOME MEDICATIONS:  1.  Allopurinol 300 mg p.o. daily.  2.  Celexa 20 mg p.o.  daily.  3.  Vitamin D 1 tablet p.o. daily.  4.  Furosemide 80 mg on nondialysis days on Tuesday, Thursday, Saturday and Sunday.  5.  Gabapentin 600 mg p.o. t.i.d.  6.  Labetalol 200 mg p.o. b.i.d.  7.  Levothyroxine 112 mcg p.o. daily.  8.  Losartan 100 mg p.o. daily.  9.  Omeprazole 40 mg p.o. daily.  10.  Vitamin B12, 1000 mcg p.o. daily.  11.  Vitamin D3, 2000 international units p.o. daily.   SOCIAL HISTORY: Lives at home with her daughter. No smoking. Occasional alcohol use.   FAMILY HISTORY: Significant for hypertension in the family.   REVIEW OF SYSTEMS:    CONSTITUTIONAL: No fever, fatigue or weakness.  EYES: No blurred vision, double vision, glaucoma or cataracts.  EARS, NOSE, THROAT: No tinnitus, ear pain, hearing loss, epistaxis or discharge.  RESPIRATORY: Positive for dyspnea and also cough. No wheeze or hemoptysis.  CARDIOVASCULAR: No chest pain. Positive for orthopnea and PND. No pedal edema. No syncope or arrhythmias at this time.  GASTROINTESTINAL: No nausea, vomiting, diarrhea, abdominal pain, hematemesis or melena.  GENITOURINARY: The patient makes minimal urine, as she is a dialysis patient. No dysuria noted.  ENDOCRINE: No polyuria, nocturia, thyroid problems, heat or cold intolerance.  HEMATOLOGIC: Positive for anemia. No easy bruising or bleeding.  SKIN: No acne, rash or lesions.  MUSCULOSKELETAL: No neck, back, shoulder pain, arthritis or gout.  NEUROLOGIC: No numbness, weakness. History of TIA and hemorrhagic CVA.  PSYCHIATRIC: No anxiety, insomnia or depression.   PHYSICAL EXAMINATION:  VITAL SIGNS: Temperature 96.3 degrees Fahrenheit, pulse 90, respirations 24, blood pressure 205/77, pulse oximetry 95% on  2 liters oxygen.   GENERAL: Well-built, well-nourished female sitting in bed in mild respiratory distress on minimal exertion.  HEENT: Normocephalic, atraumatic. Pupils equal, round, reacting to light. Anicteric sclerae. Extraocular movements intact.  Oropharynx clear, without erythema, mass or exudates.  NECK: Supple. No thyromegaly, JVD or carotid bruits. No lymphadenopathy.  LUNGS: Moving air bilaterally. She has coarse crackles in the lower two thirds posteriorly and also anteriorly. No wheeze. Minimal use of accessory muscles for breathing and on minimal exertion.  CARDIOVASCULAR: S1, S2, regular rate and rhythm, III/VI systolic murmur present. No rubs or gallops.  ABDOMEN: Soft, nontender, nondistended. No hepatosplenomegaly. Normal bowel sounds.  EXTREMITIES: She does have 3+ pedal edema, no clubbing or cyanosis, 2+ dorsalis pedis pulses palpable bilaterally.  SKIN: No acne, rash or lesions.  LYMPHATIC: No cervical lymphadenopathy.  NEUROLOGICAL: Cranial nerves intact. No focal motor or sensory deficits.  PSYCHOLOGICAL: The patient is awake, alert, oriented x 3.   LABORATORY AND RADIOLOGICAL DATA: WBC 9.9, hemoglobin 9.9, hematocrit 30.0, platelet count 279. Sodium 140, potassium 3.4, chloride 103, bicarbonate 29, BUN 23, creatinine 4.89, glucose 100, calcium 8.2. ALT 13, AST 17, alkaline phosphatase 72, total bilirubin 0.7, albumin 3.3. CK 39, CK-MB 0.7. BNP is elevated at 40,000. Troponin is 0.02. Chest x-ray revealing interstitial infiltrate, either infectious infiltrate versus pulmonary edema.   ASSESSMENT AND PLAN: A 74 year old female with hypertension, end-stage renal disease on hemodialysis, history of prior cerebrovascular accident, admitted for pulmonary edema and also malignant hypertension.  1.  Acute pulmonary edema with respiratory distress, due for dialysis today. Will be dialyzed today and seen by nephrology. Continue Lasix on nondialysis days. Also, recent pneumonia and has been noncompliant with her antibiotic treatment. Chest x-ray revealing nonedematous infiltrate cannot be ruled out, so will start on Rocephin and azithromycin and blood cultures have been ordered.  2.  Malignant hypertension: Will need dialysis now.  Continue intravenous hydralazine as needed, and also on labetalol and Cozaar at this time. Add medications as needed; the patient is intolerant to several blood pressure medications.  3.  End-stage renal disease, on Monday, Wednesday, Friday hemodialysis: Diagnosed to be secondary to fibrillary glomerulonephritis after renal biopsy. Has been on dialysis for the past year. Has right arm fistula, which is functioning properly. Nephrology has been consulted for dialysis.  4.  Transient ischemic attack and history of intracranial hemorrhage: Appears stable now, no residual neurological deficits, so avoid antiplatelets and anticoagulants at this time.  5.  Hypothyroidism. Continue Synthroid.  6.  Gastrointestinal and deep vein thrombosis prophylaxis. On Prilosec and TED stockings.  CODE STATUS: Full code.  TIME SPENT ON ADMISSION: 50 minutes.   ____________________________ Enid Baas, MD rk:jm D: 04/28/2012 14:46:42 ET T: 04/28/2012 15:06:26 ET JOB#: [id number]  cc: Enid Baas, MD, <Dictator> Munsoor Lizabeth Leyden, MD Nira Conn, MD Enid Baas MD ELECTRONICALLY SIGNED 04/30/2012 15:56

## 2014-05-05 NOTE — Op Note (Signed)
PATIENT NAME:  Jasmine SantiagoSMITH, Jami F MR#:  [id number] DATE OF BIRTH:  1940/12/21  DATE OF PROCEDURE:  07/05/2012  PREOPERATIVE DIAGNOSIS: Cataract right eye.   POSTOPERATIVE DIAGNOSIS:  Cataract right eye.   PROCEDURE PERFORMED: Extracapsular cataract extraction using phacoemulsification with placement Alcon SN6CWS, 21.0 diopter posterior chamber lens, serial number [id number].   SURGEON: Maylon PeppersSteven A. Libbie Bartley, M.D.   ANESTHESIA: 4% lidocaine and 0.75% Marcaine a 50-50 mixture with 10 units/mL of Hylenex  added, given as a peribulbar.   ANESTHESIOLOGIST: Dr. Maisie Fushomas.   COMPLICATIONS: Tear in the posterior capsule.   ESTIMATED BLOOD LOSS:   Less than 1 mL.   DESCRIPTION OF PROCEDURE: The patient was brought to the operating room and given a peribulbar block. She was then prepped and draped in the usual fashion. The vertical rectus muscles were imbricated using 5-0 silk sutures, bridle sutures. A limbal peritomy was carried out for one clock hour and hemostasis was obtained with cautery. A partial thickness scleral groove was made at the posterior surgical limbus and dissected anteriorly into clear cornea with an Alcon crescent knife. The anterior chamber was entered superonasally through clear cornea with a paracentesis knife and through the lamellar dissection with a 2.6 mm keratome. DisCoVisc was used to place the aqueous. A continuous tear circular capsulorrhexis was started. As it was coming around to approximately the level 11 o'clock, the capsulorrhexis tore out towards the periphery. There was some positive pressure at that point. A Vannas scissor was used to complete capsulorrhexis. Hydrodissection was used to loosen the nucleus and phacoemulsification was carried out in a divide and conquer technique. Ultrasound time was 1 minute and 55 seconds with an average power of 20.7% and a CDE 47.06. Irrigation-aspiration was used to remove the residual cortex. At that point, there appeared to be rent in  the posterior capsule running almost the length of the capsule. However, no vitreous presented to the wound. Decision was made to place the intraocular lens into the capsular bag despite the rent. DisCoVisc was used to inflate the bag and the lens was inserted using a [employer] shooter. It was rotated to try to keep the haptic within the bag and away from the rent. Irrigation-aspiration was used to remove the residual DisCoVisc. There was some positive pressure at that point. And the 10-0 nylon suture was placed across the wound. Balanced salt was used to inflate the wound and Miostat was then injected into the paracentesis tract. The lens appeared to stay in good position throughout the procedure. Two additional 10-0 nylon sutures were placed at the wound to ensure that it would be water tight and a single suture was placed across the paracentesis track to ensure that it would be water tight. The wound was checked for leaks and none were found. The chamber was deep and the lens was in good position. Two drops of Vigamox were placed in the eye. The bridle suture was removed and conjunctiva was closed with cautery. The lid speculum was removed and a shield was placed over the eye. The patient was discharged to the recovery room in good condition    ____________________________ Maylon PeppersSteven A. Karriem Muench, MD sad:cc D: 07/05/2012 14:10:28 ET T: 07/05/2012 16:32:58 ET JOB#: [id number]  cc: Viviann SpareSteven A. Sumiko Ceasar, MD, <Dictator> Erline LevineSTEVEN A Lusia Greis MD ELECTRONICALLY SIGNED 07/07/2012 11:37

## 2014-05-05 NOTE — H&P (Signed)
PATIENT NAME:  Jasmine Murphy, Jasmine Murphy MR#:  [id number] DATE OF BIRTH:  1940-07-03  DATE OF ADMISSION:  10/04/2012  PRIMARY CARE PHYSICIAN: None.   PRIMARY NEPHROLOGIST:  Dr. Lateef/Dr. Wynelle Link.   CHIEF COMPLAINT: Chest pain with shortness of breath.   HISTORY OF PRESENTING ILLNESS: A 74 year old female patient with history of hypertension, end-stage renal disease, prior CVA, presents to the hospital complaining of acute onset of midsternal chest pain radiating to both shoulders. The patient had this pain start at rest during the night, which woke her up. It last about 1 to 1-1/2 hours. This was associated with nausea, shortness of breath. She went to the bathroom. This slowly eased off and returned earlier in the morning, today, and with similar pain lasting about 1 hour, and the patient presented to the ER. Here, the patient is chest pain free. Has received aspirin through EMS. Did not need any nitro. Presently is comfortable, lying in bed.   She has had some pain in the chest secondary to GERD in the past which is different from her pain. The patient's EKG shows normal sinus rhythm. Cardiac enzymes are normal, but considering her risk factors and chest pain, the patient is being admitted for observation on tele to rule out acute coronary syndrome.   The patient gets her dialysis on Monday, Wednesday, Friday, and today is due to get her medications.   PAST MEDICAL HISTORY: 1.  Hypertension.  2.  Gout.  3.  CVA.  4.  End-stage renal disease with hemodialysis on Monday, Wednesday, Friday; right AV fistula.  5.  Migraine headaches.   PAST SURGICAL HISTORY: Cholecystectomy, hysterectomy, right AV fistula.   ALLERGIES: CODEINE, COREG, HYDRALAZINE, NORVASC, PENICILLIN AND VERAPAMIL.   MEDICATIONS:  Include: 1.   Vitamin D3, 2000 International Units daily.  2.  Vitamin B12, 1000 mcg daily.  3.  Omeprazole 40 mg daily.  4.  Nifedipine 60 mg daily.  5.  Losartan 100 mg daily.  6.  Levothyroxine 112  mcg daily.  7.  Labetalol 200 mg daily.  8.  Gabapentin 300 mg 3 times a day.  9.  Lasix 80 mg daily.  10.  Multivitamin daily.  11.  Citalopram 20 mg daily.   FAMILY HISTORY:  Significant for hyperextension. No premature coronary artery disease in the family.   SOCIAL HISTORY:  The patient lives at home with her daughter. Used to smoke in the past. Has 34-year smoking history, but quit 10 years back. Occasional alcohol. No illicit drugs.   CODE STATUS: FULL CODE.   REVIEW OF SYSTEMS:  CONSTITUTIONAL:  No fever, fatigue, weakness.  EYES:  No blurred vision, pain or redness.  ENT:  No tinnitus, ear pain, hearing loss.  RESPIRATORY:  No cough, wheeze, hemoptysis.  CARDIOVASCULAR:  Had chest pain.  No orthopnea, edema.  GASTROINTESTINAL:  No nausea, vomiting, diarrhea, abdominal pain.  GENITOURINARY:  No dysuria, hematuria, frequency.  ENDOCRINE:  No polyuria or nocturia. Has thyroid problems.  HEMATOLOGIC AND LYMPHATIC:  Has anemia of chronic disease.  INTEGUMENTARY:  No acne, rash, lesions.  MUSCULOSKELETAL:  No back pain, arthritis.  NEUROLOGIC:  No focal numbness or weakness. Had stroke in the past.    PSYCHIATRIC:  No anxiety or depression.  PHYSICAL EXAMINATION: VITAL SIGNS:  Shows temperature 97.7, pulse 82, blood pressure 185/56, saturating 94% on room air.  GENERAL: Elderly Caucasian female patient lying in bed, comfortable, conversational, cooperative with exam.  PSYCHIATRIC:  Alert and oriented x 3. Mood and affect appropriate. Judgment  intact.  HEENT:  Atraumatic, normocephalic. Oral mucosa moist and pink. External ears and nose normal. No pallor. No icterus. Pupils bilaterally equal and reactive to light.  NECK:  Supple. No thyromegaly. No palpable lymph nodes. Trachea midline. No carotid bruit, JVD.  CARDIOVASCULAR:  S1, S2 without any murmurs. Peripheral pulses 2+.  RESPIRATORY:  Normal work of breathing. Clear to auscultation on both sides.  GASTROINTESTINAL:  Soft  abdomen, nontender. Bowel sounds present. No organomegaly palpable.  SKIN:  Warm and dry. No petechiae, rash, ulcers.  MUSCULOSKELETAL:  No joint swelling, redness, effusion of the large joints. Normal muscle tone.  NEUROLOGICAL:  Motor strength is 5/5 in upper and lower extremities. Sensation to fine touch intact all over.  GENITOURINARY:  No CVA, tenderness or bladder distention.  LYMPHATIC:  No cervical lymphadenopathy.  VASCULAR:  Has right upper extremity AV fistula.   DIAGNOSTICS, IMAGING AND LABORATORY STUDIES: Show glucose of 93, BUN 41, creatinine 6.74. Troponin less than 0.02. WBC 8.9, hemoglobin 9.7, platelets of 228.   EKG shows normal sinus rhythm.   Chest x-ray shows no pulmonary edema or infiltrate.   ASSESSMENT AND PLAN: 1.  Chest pain at rest in a patient with significant risk factors with past smoking, hypertension and end-stage renal disease. We will admit the patient, get a stress test. Cardiac enzymes and EKG are normal. The patient is on omeprazole at home, but does not have any diagnosis of GERD. Her pain could be secondary to GERD. If the stress test is negative, would increase her PPIs to twice a day. We will start the patient on aspirin.  2.  Uncontrolled hypertension. The patient has had problems with hypotension in the past. She is due for dialysis later today. We will continue on medications. Consult nephrology for her dialysis. Hopefully, the blood pressure will trend down to normal when she has the dialysis.  3.  End-stage renal disease. Consult heme/nephrology.  4.  Anemia of chronic disease. The patient's baseline hemoglobin seems to be around 9 to 10. This is stable.  5.  Deep venous thrombosis prophylaxis with heparin.   CODE STATUS: FULL CODE.   Time spent today on this case was 40 minutes.     ____________________________ Molinda BailiffSrikar R. Claudio Mondry, MD srs:dmm D: 10/04/2012 11:35:01 ET T: 10/04/2012 11:54:41 ET JOB#: [id number]  cc: Wardell HeathSrikar R. Elpidio AnisSudini, MD,  <Dictator> Munsoor Lizabeth LeydenN. Lateef, MD Laverda SorensonSarath C. Kolluru, MD Orie FishermanSRIKAR R Yannely Kintzel MD ELECTRONICALLY SIGNED 10/04/2012 15:25

## 2014-05-05 NOTE — Discharge Summary (Signed)
PATIENT NAME:  Jasmine Murphy, Jasmine Murphy MR#:  [id number] DATE OF BIRTH:  [birth year]  DATE OF ADMISSION:  06/27/2012 DATE OF DISCHARGE:  06/28/2012  PRIMARY CARE PHYSICIAN: Dr. Nira ConnAriana Pancaldo.   NEPHROLOGIST: Dr. Thedore MinsSingh.   FINAL DIAGNOSES:  1. Possible syncope versus fall with orbital floor fracture and bruising of the right face.  2. End-stage renal disease, on hemodialysis.  3. Hypertension.  4. Hypothyroidism.  5. Gastroesophageal reflux disease.   MEDICATIONS ON DISCHARGE: Include vitamin B12 1000 mcg 1 tablet daily, vitamin D3 2000 international units 1 tablet daily, losartan 100 mg at bedtime, gabapentin 600 mg 3 times a day, labetalol 200 mg twice a day, furosemide 80 mg 1 tablet once a day except on hemodialysis days Monday, Wednesday, Friday, omeprazole 40 mg daily, levothyroxine 112 mcg daily, clindamycin 300 mg 1 capsule every 8 hours for 6 days, Celexa 20 mg daily.   FOLLOWUP: With Dr. Jenne CampusMcQueen, ENT, 1 week. Follow up with Dr. Thedore MinsSingh at dialysis Monday, Wednesday, Friday. Follow up in 1 to 2 weeks with Dr. Nira ConnAriana Pancaldo.    HOSPITAL COURSE: The patient was admitted on June 15th as an observation and discharged on June 16th. Came in with fall, facial bruise and contusion. She was admitted as an observation. Had right periorbital floor fracture. Was started on clindamycin empirically. Was monitored overnight on telemetry. Cardiac enzymes x3.   LABORATORY AND RADIOLOGICAL DATA: Included a CT maxillofacial area without contrast, showed nondisplaced right orbital floor fracture. CT CERVICAL SPINE: Negative. CT SCAN OF THE HEAD: Scalp hematoma, atrophy, chronic microvascular ischemic disease and right thalamic lacunar infarct. No acute intracranial abnormality. Troponin negative. Glucose 89, BUN 42, creatinine 5.84, sodium 138, potassium 3.7, chloride 102, CO2 26, calcium 8.5. Liver function tests normal range. White blood cell count 9.7, H and H 12.0 and 36.3, platelet count of 228. BNP 12,957. EKG  showed a normal sinus rhythm, nonspecific ST-T wave changes. RIGHT HAND: No acute abnormality. RIGHT FOREARM: No acute abnormality. Urinalysis 100 mg/dL protein, 2+ blood. Next 2 troponins negative.   HOSPITAL COURSE PER PROBLEM LIST:  1. For the patient's syncope versus fall, the patient has an orbital floor fracture and bruising of the right face. I advised her no anti-inflammatories at this point. The patient asymptomatic at this point. No arrhythmias on telemetry. Stable for discharge home. No pain with the orbital floor fracture. Able to move her eyes without any problems. Can follow up with ENT, Dr. Jenne CampusMcQueen, in 1 week.  2. End-stage renal disease, on hemodialysis. The patient had hemodialysis here. Can continue Monday, Wednesday, Friday schedule at her usual dialysis center.  3. Hypertension. Blood pressure elevated here. Unfortunately, they do not give meds prior to dialysis. Blood pressure 184/64 upon discharge.  4. Hypothyroidism, on levothyroxine.  5. Gastroesophageal reflux disease, on omeprazole.    TIME SPENT ON DISCHARGE: 35 minutes.   ____________________________ Herschell Dimesichard J. Renae GlossWieting, MD rjw:gb D: 06/28/2012 16:07:23 ET T: 06/28/2012 23:52:20 ET JOB#: [id number]  cc: Herschell Dimesichard J. Renae GlossWieting, MD, <Dictator> Nira ConnAriana Pancaldo, MD Mosetta PigeonHarmeet Singh, MD Davina Pokehapman T. McQueen, MD   Salley ScarletICHARD J Lamichael Youkhana MD ELECTRONICALLY SIGNED 07/01/2012 13:14

## 2014-05-05 NOTE — H&P (Signed)
PATIENT NAME:  Jasmine Murphy, Jasmine Murphy MR#:  [id number] DATE OF BIRTH:  June 22, 1940  DATE OF ADMISSION:  06/27/2012  PRIMARY CARE PHYSICIAN:  Dr. Nira Conn.    NEPHROLOGIST:  Dr. Thedore Mins at [facility].   CHIEF COMPLAINT:  Fall with fascial bruise and contusion.   HISTORY OF PRESENT ILLNESS:  Miss Shreeve is a 74 year old female with history of end-stage kidney disease, on hemodialysis Monday, Wednesday, Friday for the last 2 years, history of hypothyroidism, trigeminal neuralgia and a history of CVA, along with a history of gout. Comes in the Emergency Room after she had sustained a fall at the curbside while trying to get back to church to get her pocketbook with family members. The patient says she was doing well, did not have any prefall symptoms. She states it was an "accidental fall" where she landed on the street side, hit the side of the walkway on her right side of the face causing contusion, swelling and CT of these maxillofacial areas of the right shows right orbital floor fracture. The patient is being admitted for further evaluation and management. Next, in the Emergency Room, she is hemodynamically stable, remains in sinus rhythm. Denies any chest pain or shortness of breath.   PAST MEDICAL HISTORY: 1.  Hypertension.  2.  Gout.  3.  Cerebrovascular accident.    4.  History of end-stage renal disease, on hemodialysis. Has a right atrioventricular fistula.  5.  History of migraine headaches.   PAST SURGICAL HISTORY:  1.  Cholecystectomy.  2.  Hysterectomy.  3.  Right atrioventricular fistula creation.   ALLERGIES: CODEINE, COREG, HYDRALAZINE, NORVASC, PENICILLIN AND VERAPAMIL.    MEDICATIONS:  1.  Vitamin D3 2000 International Units daily.  2.  Vitamin B12 1000 mcg p.o. daily.  3.  Omeprazole 40 mg daily.  4.  Nifedipine 60 mg daily.  5.  Losartan 100 mg daily at bedtime.  6.  Levothyroxine 112 mcg p.o. daily.  7.  Labetalol 200 mg b.i.d.  8.  Gabapentin  600 mg t.i.d.  9.  Lasix 80 mg p.o. daily.  10.  Daily-Vite Supra M.D. 1 tablet daily.  11.  Citalopram 20 mg daily.   FAMILY HISTORY:  Significant for hypertension.   SOCIAL HISTORY:  Lives at home with her daughter. No smoking. Occasional alcohol use.   REVIEW OF SYSTEM:    CONSTITUTIONAL:  No fever. Positive for fatigue and weakness.  EYES:  No blurred or double vision, glaucoma or cataract.  ENT:  No tinnitus, ear pain, hearing loss.  RESPIRATORY:  No cough or wheeze, hemoptysis or dyspnea.  CARDIOVASCULAR:  No chest pain, orthopnea, edema or arrhythmia.  GASTROINTESTINAL:  No nausea, vomiting, diarrhea, abdominal pain. Without GERD.  GENITOURINARY:  No dysuria or hematuria.  ENDOCRINE: No polyuria, nocturia or thyroid problems.  HEMATOLOGY:  Positive for chronic anemia. No bleeding or easy bruising.  SKIN:  No acne or rash.  MUSCULOSKELETAL:  Positive for arthritis.  NEUROLOGIC:  No CVA or transient ischemic attack, dementia or dysarthria.  PSYCHIATRIC:  No anxiety or depression.   All other systems reviewed are negative.   PHYSICAL EXAMINATION: GENERAL:  The patient is awake, alert, oriented x 3, not in acute distress.  VITAL SIGNS:  She is afebrile, pulse is 82, blood pressure is 188/83, pulse ox is 94% on room air.  HEENT:  The patient does have a right-sided fascial bruise all over the right half of the face, along with swelling around the periorbital area. There  is no active bleeding. There is a small bruise. The patient does have a small contusion, skin tear over the nose.   NECK:  Supple. No JVD. No carotid bruit.  RESPIRATORY:  Clear to auscultation bilaterally. No rales, rhonchi, respiratory distress or labored breathing.  CARDIOVASCULAR:  Both her heart sounds are normal. No murmur heard. PMI not lateralized.  CHEST:  Nontender.  EXTREMITIES:  Good pedal pulses, good femoral pulses. Trace lower extremity edema. The patient has right AV fistula present.  ABDOMEN:   Soft, benign, nontender. No organomegaly. Positive bowel sounds.  RESPIRATORY:  Clear to auscultation bilaterally. No rales, rhonchi, respiratory distress.   EKG shows normal sinus rhythm. UA:  Negative for urinary tract infection.   RIGHT FOREARM:  No acute abnormality.   Right hand shows no acute abnormality. DJD present. B-type natriuretic peptide is 12,957.   CARDIAC ENZYMES:  First set negative.   CBC:  Within normal limits. Comprehensive metabolic panel within normal limits except a BUN of 42. CT of the head shows scalp hematoma, atrophy with chronic microvascular ischemic disease and right thalamic lacunar infarct. _____ periorbital and maxillofacial with hyperdense fluid in the right maxillary sinus which could present hemorrhage secondary to trauma.   CT CERVICAL SPINE:  No acute cervical spine bony abnormality.   CT maxillofacial area without contrast shows nondisplaced right orbital floor fracture. There is soft tissue hematoma, especially of the right frontal and supraorbital area. Nasal septum approximates midline.   ASSESSMENT:  A 74 year old, the patient, with history of hypertension and end-stage renal disease, on hemodialysis. Is admitted with:  1.  Right periorbital floor fracture, status post fall. Appears to be accidental fall today. The patient will be started on clindamycin 300 mg t.i.d. for prophylaxis until she is seen by ENT, Dr. Erline Hauhap McQueen as outpatient. The patient will be given p.r.n. Tylenol for pain.  2.  Possible syncopal episode. Will monitor patient overnight on telemetry floor. She remains in sinus rhythm at this time. We will check cardiac enzymes x 3.  3.  Hypertension. Continue home medications, which is nifedipine, Losartan, labetalol.  4.  End-stage renal disease. Will consult nephrology to continue in-house hemodialysis.  5.  Hypothyroidism. Only Synthroid.  6.  Gastroesophageal reflux disease, on omeprazole. Further workup on patient's clinical  course. Hospital admission plan was discussed with patient, and patient is agreeable to it. She is a full code.   TIME SPENT:  Fifty minutes.   ____________________________ Wylie HailSona A. Allena KatzPatel, MD sap:nts D: 06/27/2012 17:37:52 ET T: 06/27/2012 21:42:44 ET JOB#: [id number]  cc: Corena Tilson A. Allena KatzPatel, MD, <Dictator> Nira ConnAriana Pancaldo, MD Willow OraSONA A Jemimah Cressy MD ELECTRONICALLY SIGNED 07/07/2012 14:20

## 2014-05-05 NOTE — Discharge Summary (Signed)
PATIENT NAME:  Jasmine Murphy, Jasmine Murphy MR#:  [id number] DATE OF BIRTH:  [birth year]  DATE OF ADMISSION:  04/28/2012 DATE OF DISCHARGE:  04/30/2012  ADMITTING PHYSICIAN:  Dr. Gladstone Lighter  DISCHARGING PHYSICIAN:  Dr. Gladstone Lighter  PRIMARY CARE PHYSICIAN AND NEPHROLOGIST:  Dr. Anthonette Legato  CONSULTATIONS IN THE HOSPITAL:  Nephrology consultation by Dr. Holley Raring.   DISCHARGE DIAGNOSES: 1.  Acute pulmonary edema.  2.  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  3.  End-stage renal disease, on Monday, Wednesday, Friday hemodialysis.  4.  Hypothyroidism.  5.  Transient ischemic attack.  6.  Recent pneumonia.  MEDICATIONS AT THE TIME OF DISCHARGE:  1.  Dialyvite supplement 1 tablet p.o. daily.  2.  Vitamin B12 1000 mcg p.o. daily.  3.  Vitamin D3 2000 international units p.o. daily.  4.  Losartan 100 mg p.o. once a day at bedtime.  5.  Gabapentin 600 mg p.o. t.i.d.  6.  Celexa 20 mg p.o. daily.  7.  Labetalol 200 mg p.o. b.i.d.  8.  Nifedipine 60 mg p.o. daily.  9.  Lasix 80 mg on Tuesday, Thursday, Saturday and Sunday. 10.  Omeprazole 40 mg p.o. daily.  11.  Levothyroxine 112 mcg p.o. daily.   DISCHARGE DIET:  Renal diet.   DISCHARGE ACTIVITY:  As tolerated.  FOLLOWUP INSTRUCTIONS: The patient advised to follow up with nephrologist for her dialysis on this Monday, 05/03/2012. Also advised to take medications and being compliant with them.  LABORATORY AND IMAGING STUDIES:  Hepatitis B surface antigen is negative. PTH level is elevated at 288.  Blood cultures are negative on admission. BNP was elevated at 40,123.  WBC is 9.9, hemoglobin 9.9, hematocrit 30.0, platelet count 279.  Sodium 140, potassium 3.4, chloride 103, bicarb 29, BUN 23, creatinine 4.89, glucose 100 and calcium of 8.2.   ALT 13, AST 17, alk phos 72, total bili 0.7, albumin of 3.3. Troponin is negative and chest x-ray showing interstitial infiltrates likely pulmonary edema.  BRIEF HOSPITAL  COURSE:  The patient is a 74 year old Caucasian female with past medical history significant for malignant hypertension, end-stage renal disease, on Monday, Wednesday, Friday hemodialysis who is known to be noncompliant with her medications at some times, presents with difficulty breathing, worsening pedal edema and was found to have pulmonary edema.  1.  Acute pulmonary edema secondary to fluid retention from her malignant hypertension and end-stage renal disease. She was due for dialysis and it was on the day of admission and she got dialyzed on 3 consecutive days.  She will follow up with her next dialysis on Monday with her nephrologist. Her symptoms have improved and she is on room air, able to ambulate, pedal edema improved and pulmonary edema improved as well prior to discharge.  2.  Malignant hypertension. The patient needs to be adherent with her medications, so she was strongly counseled this admission and also nifedipine has been added to her medication regimen. Her course has been otherwise uneventful in the hospital.   DISCHARGE CONDITION:  Stable.   DISCHARGE DISPOSITION:  Home.  TIME SPENT ON DISCHARGE:  40 minutes     ____________________________ Gladstone Lighter, MD rk:ce D: 04/30/2012 15:06:01 ET T: 05/01/2012 10:24:10 ET JOB#: [id number]  cc: Gladstone Lighter, MD, <Dictator>  Dr. Anthonette Legato Gladstone Lighter MD ELECTRONICALLY SIGNED 05/13/2012 15:34

## 2014-05-06 NOTE — H&P (Signed)
PATIENT NAME:  Jasmine Murphy, Jasmine Murphy MR#:  [id number] DATE OF BIRTH:  1940/06/18  DATE OF ADMISSION:  05/03/2013  PRIMARY CARE PHYSICIAN: Nonlocal.   REFERRING PHYSICIAN: Dr. Minna Antis.   CHIEF COMPLAINT: Fall.  HISTORY OF PRESENT ILLNESS: The patient is a 74 year old female with history of end-stage renal disease on hemodialysis Monday, Wednesday, Friday, who comes to the Emergency Department after having a fall. The patient states was standing up, felt weak in the legs, and fell down to the floor. Work-up in the Emergency Department, the patient is found to have left inferior-superior pelvic rami fracture. The patient is confused, a poor historian. The patient states she lives with her daughter.   PAST MEDICAL HISTORY: 1.  End-stage renal disease on hemodialysis Monday, Wednesday, Friday.  2.  History of CVA.  3.  Hypertension.  4.  Hypothyroidism.  5.  Gout. 6.  Migraine headaches.  7.  Pneumonia.   PAST SURGICAL HISTORY: 1.  Cholecystectomy.  2.  Hysterectomy.  3.  Right AV fistula graft.    ALLERGIES:  1.  CODEINE.  2.  COREG.  3.  HYDRALAZINE. 4.  NORVASC. 5.  PENICILLIN.  6.  VERAPAMIL.   HOME MEDICATIONS: 1.  Renvela 800 mg 1 tablet 3 times a day.  2.  Rena-Vite 1 tablet once a day.  3.  Quetiapine 50 mg a day.  4.   75 mg daily.  5.  Protonix 40 mg once a day.  6.  Nifedipine 30 mg once a day.  7.  Multivitamin 1 tablet daily.  8.  Losartan 100 mg once a day.  9.  Levaquin 137 mcg once a day.  10.  Lasix 80 mg once a day.  11.  Labetalol 200 mg once a day.  12.  Gabapentin 300 mg 3 times a day.  13.  Citalopram 20 mg once a day.  14.  Aspirin 81 mg daily.  15.  Allopurinol 100 mg once a day.  16.  Combivent every 6 hours.    SOCIAL HISTORY: No history of drinking alcohol or using illicit drugs. Former smoker, with 34 pack-year history of smoking. The patient states lives with her daughter; however, the information seems to be unreliable, as the patient is  confused.   REVIEW OF SYSTEMS: Unable to obtain history, as the patient is somewhat confused.   PHYSICAL EXAMINATION: GENERAL: This is a well-built, well-nourished, age-appropriate female, lying down in the bed, not in distress.  VITAL SIGNS: Temperature 98.4, pulse 86, blood pressure 143/52, respiratory rate of 20, oxygen saturation is 98% on 2 liters of oxygen.  HEENT: Head normocephalic, atraumatic. No scleral icterus. Conjunctivae normal. Pupils equal and react to light. Extraocular movements are intact. Mucous membranes moist. No pharyngeal erythema.  NECK: Supple. No lymphadenopathy. No JVD. No carotid bruit.  CHEST: Has no focal tenderness.  LUNGS: Bilaterally clear to auscultation.  HEART: S1, S2 regular. No murmurs are heard.  ABDOMEN: Bowel sounds present. Soft, nontender, nondistended.  EXTREMITIES: No pedal edema. Pulses are 2+. NEUROLOGIC: The patient is alert, oriented to person but not to place and time. No apparent cranial nerve abnormalities. Motor 5/5 in upper and lower extremities.   LABORATORY DATA: CBC: WBC of 10.7, hemoglobin 10.3, platelet count of 242, MCV 105. Complete metabolic panel: BUN 43, creatinine of 5.21. The rest of the values are within normal limits.   Urinalysis negative for nitrites and leukocyte esterase. Left hip: Completely, acute left superior inferior pubic rami fracture. Chest x-ray, one view  portable: Degree of congestive heart failure.   ASSESSMENT AND PLAN: The patient is a 74 year old female who comes to the Emergency Department after having a fall, sustaining in the left inferior pubic rami fracture.   1.  Pelvic fracture. The patient does not require any surgery. Will admit the patient to a medical bed, involve physical therapy in the morning, consult orthopedic surgery. We will need to further discuss with the patient's daughter regarding the patient's social situation.  2.  End-stage disease on hemodialysis. The patient due for dialysis  tomorrow. Obtain nephrology consult.  3.  Hypertension, currently well controlled. Continue with home medications.  4.  Frequent falls. The patient is on multiple sedative medications, quetiapine and gabapentin during the daytime, which keeps the patient at risk of fall. Hold any sedative medications during daytime.  5.  Keep the patient on deep vein thrombosis prophylaxis with heparin.   TIME SPENT: 50 minutes.   ____________________________ Susa GriffinsPadmaja Anasha Perfecto, MD pv:cg D: 05/04/2013 01:57:15 ET T: 05/04/2013 02:47:35 ET JOB#: [id number]  cc: Susa GriffinsPadmaja Eliberto Sole, MD, <Dictator> Susa GriffinsPADMAJA Dalbert Stillings MD ELECTRONICALLY SIGNED 05/12/2013 21:29

## 2014-05-06 NOTE — Discharge Summary (Signed)
PATIENT NAME:  Jasmine Murphy, Jasmine Murphy DATE OF BIRTH:  [birth year]  DATE OF ADMISSION:  03/28/2013 DATE OF DISCHARGE:  04/01/2013   ADMITTING DIAGNOSES:  1. Confusion.  2. Acute hypercarbic respiratory failure.   DISCHARGE DIAGNOSES:  1. Acute encephalopathy likely due to hypercarbic respiratory failure, possible cause due to underlying sleep apnea. The patient may also have underlying dementia with some behavioral issues.  2. Acute diastolic congestive heart failure, hemodialysis per nephrology.  3. End-stage renal disease, hemodialysis.  4. History of cerebrovascular accident, on aspirin.  5. Hypothyroidism with elevated TSH. Supplements have been increased.  6. Gout.  7. Hypokalemia.  8. Elevated troponin, felt to be demand ischemia per cardiology.  9. History of migraine headaches.  10. Status post cholecystectomy.  11. Status post hysterectomy.  12. Status post right arteriovenous fistula graft.   PERTINENT LABS AND EVALUATIONS: Admitting troponin 0.14. WBC count 7.1, hemoglobin 9.8, platelet count was 207. ABG: pH of 7.3, pCO2 of 66, pO2 of 82. Glucose 79, BUN 14, creatinine 3.42, sodium 138, potassium 3. EKG showed normal sinus rhythm with nonspecific ST-T wave changes. Chest x-ray with bilateral infiltrates.   CONSULTANTS: Laurier NancyShaukat A. Khan, MD, and Dr. Mady HaagensenMunsoor Lateef, MD.   HOSPITAL COURSE: Please refer to H and P done by the admitting physician. The patient is a 74 year old white female with a recent admission for acute respiratory failure due to pneumonia in February, who was discharged to rehab facility, then she was taken back to home, who presents was brought in with confusion. The patient was noted to be hypoxic at the dialysis center and was confused. Therefore, was brought to the Emergency Room. ABG did reveal her to be hypercarbic and hypoxic. She was admitted for acute diastolic CHF exacerbation. The patient was treated with serial hemodialysis treatments, with  improvement in her respiratory failure. In terms of her hypercapnia, it was felt to be due to CO2 retention as a result of possible sleep apnea, based on her echocardiogram which did show pulmonary hypertension. The patient is not able to use a CPAP machine due to the mask. I have discussed with the patient's daughter regarding her confusion, and she states that the patient has had this confusion ongoing on and off for the past year. She likely has some dementia as well. The patient is doing much better at this point. Was started on some Zyprexa, and I did discuss with her daughter regarding her code status. The patient was made DNR during the hospitalization. At this time, she is doing much better and is stable for discharge to the skilled nursing facility.   DISCHARGE MEDICATIONS:  1. Losartan 100 daily. 2. Lasix 80 daily.  3. Labetalol 200 daily. 4. Dialyvite Plus 1 tab p.o. daily.  5. Nifedipine 60 mg 1/2 tab p.o. daily.  6. Allopurinol 100 daily.  7. Gabapentin 300 one tab p.o. t.i.d. 8. Protonix 40 daily.  9. Citalopram 20 daily. 10. DuoNebs 4 times a day as needed. 11. Renvela 800 mg 1 tab p.o. t.i.d.  12. Thera multivitamins 1 tab p.o. daily.  13. Levothyroxine 137 mcg daily.  14. Aspirin 81 one tab p.o. daily.  15. Olanzapine 2.5 one tab p.o. b.i.d.   DIET: Renal diet.  OXYGEN: 2 liters of nasal cannula at all times.   ACTIVITY: As tolerated with PT evaluation and treatment.   DISCHARGE INSTRUCTIONS: Follow up with primary MD in 1 to 2 weeks. Please also resume hemodialysis as doing previously.   TIME SPENT:  35 minutes spent on the discharge.    ____________________________ Lacie Scotts. Allena Katz, MD shp:lb D: 04/01/2013 12:15:05 ET T: 04/01/2013 13:23:55 ET JOB#: [id number]  cc: Izael Bessinger H. Allena Katz, MD, <Dictator> Charise Carwin MD ELECTRONICALLY SIGNED 04/05/2013 11:27 Charise Carwin MD ELECTRONICALLY SIGNED 04/05/2013 11:27 Jazlene Bares H Marylyn Appenzeller MD ELECTRONICALLY SIGNED  04/05/2013 11:27 Treyveon Mochizuki H Mynor Witkop MD ELECTRONICALLY SIGNED 04/05/2013 11:27

## 2014-05-06 NOTE — Discharge Summary (Signed)
PATIENT NAME:  Jasmine Murphy, Jasmine Murphy MR#:  [id number] DATE OF BIRTH:  12-20-1940  DATE OF ADMISSION:  05/03/2013 DATE OF DISCHARGE:  05/05/2013   PRIMARY CARE PHYSICIAN: Over at [facility].   FINAL DIAGNOSES:  1. Superior and inferior left pubic rami fractures.  2. End-stage renal disease, on hemodialysis.  3. Hypertension.  4. Gout.  5. Hypothyroidism.  6. Gastroesophageal reflux disease.   MEDICATIONS ON DISCHARGE: Include:  1. Allopurinol 100 mg daily.  2. Gabapentin 300 mg 3 times a day. 3. Celexa 20 mg daily. 4. Renvela 800 mg 3 times a day.  5. Aspirin 81 mg daily.  6. Levothyroxine 137 mcg daily. 7. Albuterol/ipratropium 3 mL inhaled every 6 hours as needed for wheezing.  8. Losartan 100 mg daily.  9. Multivitamin 1 tablet daily. 10. Protonix 40 mg daily.  11. Seroquel 50 mg 1.5 tablets once a day at 1430.  12. Rena-Vite B complex and C and folic acid 1 tablet daily.  13. Seroquel 50 mg once a day as needed for mood.  14. Nifedipine 60 mg once a day.  15. Lasix 80 mg daily on nondialysis days.  16. Acetaminophen/hydrocodone 325/5 mg 1 tablet every 6 hours as needed for pain. 17. Tylenol 650 mg every 6 hours as needed for pain.  The patient refused labetalol.   HOME OXYGEN: As needed 1 liter nasal cannula.   DIET: Renal diet, regular consistency.   ACTIVITY: As tolerated.   REFERRALS:  1. Physical therapy.  2. Dialysis Monday, Wednesday and Friday.  3. In 1 to 2 days with doctor at rehab.   REASON FOR ADMISSION: The patient was admitted 05/03/2013 with a fall, found to have pelvic fracture.   LABORATORY AND RADIOLOGICAL DATA DURING THE HOSPITAL COURSE: Included an EKG that showed normal sinus rhythm; no acute ST-T wave changes. Chest x-ray was negative. A left hip complete showed acute left superior and inferior pubic rami fractures. No hip fracture. Glucose 100, BUN 43, creatinine 5.21, sodium 141, potassium 4.0, chloride 102, CO2 of 30, calcium 8.5. Liver function  tests normal range. Albumin low at 2.9. White blood cell count 10.7, H and H 10.3 and 32.7, platelet count of 242. Urinalysis showed 2+ blood, otherwise negative. White count on discharge 7.1, hemoglobin 9.8, platelet count of 193. Creatinine 5.91, BUN 49, potassium 4.2.   HOSPITAL COURSE PER PROBLEM LIST:  1. For the superior and inferior left pubic rami fractures, the patient was seen in consultation by orthopedics, who recommended weight-bearing on the leg and physical therapy. The patient can go back to her facility to continue physical therapy. Must walk with a walker and supervision at all times. High risk for falls.  2. End-stage renal disease, on hemodialysis. The patient had dialysis on Wednesday and can go back to normal schedule, Monday, Wednesday, Friday.  3. Hypertension. The patient refused her labetalol, thinking her blood pressure goes too low. Will compensate by increasing the Adalat CC to 60 mg daily and hold off on the labetalol for right now. Do Lasix on nondialysis days and continue losartan.  4. Gout, on allopurinol.  5. Hypothyroidism, on levothyroxine.  6. Gastroesophageal reflux disease, on Protonix.    TIME SPENT ON DISCHARGE: 40 minutes.   ____________________________ Herschell Dimesichard J. Renae GlossWieting, MD rjw:lb D: 05/05/2013 08:44:06 ET T: 05/05/2013 08:55:26 ET JOB#: [id number]  cc: Herschell Dimesichard J. Renae GlossWieting, MD, <Dictator> [facility] -  Salley ScarletICHARD J Leshawn Straka MD ELECTRONICALLY SIGNED 05/06/2013 17:04

## 2014-05-06 NOTE — Consult Note (Signed)
Brief Consult Note: Diagnosis: Left superior and inferior pubic rami fractures status post fall.   Patient was seen by consultant.   Recommend further assessment or treatment.   Orders entered.   Comments: Patient is a 74 y/o female who fell at home yesterday.  She was diagnosed with left superior and inferior pubic rami fractures by [facility].  Patient is seen in her hospital bed this evening.  She states she has mild pain in the left hip area.  She denies other injuries.  On exam, she has tenderness over the pubic rami.  She has no pain with log rolling of the left hip.  She has an intact motor and sensory exam in the left lower extremities.  She has palpable pedal pulses.  I reviewed the pelvis film and the fractures of the rami are minimally displaced.  There is no evidence of hip fracture, dislocation or osteoarthritis.  I recommend the patient is evaluated by PT.  I recommend WBAT on the left lower extremity with a walker.  No surgical intervention will be required for this injury.  Continue current pain management.  I will sign off for now.  The patient may follow up with me in 6 weeks in the office for re-evaluation and xray.  Electronic Signatures: Juanell FairlyKrasinski, Muhanad Torosyan (MD)  (Signed 22-Apr-15 18:35)  Authored: Brief Consult Note   Last Updated: 22-Apr-15 18:35 by Juanell FairlyKrasinski, Islam Villescas (MD)

## 2014-05-06 NOTE — Consult Note (Signed)
Brief Consult Note: Diagnosis: Fractures of left ring and small finger proximal phannges.   Patient was seen by consultant.   Recommend further assessment or treatment.   Comments: Patient is a patient that I have seen in my office for the above noted problem.  I had placed her an ulnar gutter splint and buddy taped the small and ring fingers together.  On exam today, the patients laceration over the dorsal webspace b/w the rign and small fingers is healed and dry.  There is no evidence for infection.  She can flex and extend her ring and small finger PIP and MP joints without pain.  There is no angular or rotational deformity on exam.   She has no tenderness over the fracture sites today.  The buddy tape strap is on currently, but her ulnar splint is on her bedside table.  I personally replaced the splint.  She should wear the splint at all times.  I will see her at our regularly schedule appointment date in my office after she is discharged from the hospital.  Electronic Signatures: Juanell FairlyKrasinski, Rhyland Hinderliter (MD)  (Signed 06-Feb-15 15:34)  Authored: Brief Consult Note   Last Updated: 06-Feb-15 15:34 by Juanell FairlyKrasinski, Diron Haddon (MD)

## 2014-05-06 NOTE — Consult Note (Signed)
PATIENT NAME:  Jasmine Murphy, Jasmine Murphy MR#:  [id number] DATE OF BIRTH:  08-06-1940  CARDIOLOGY CONSULTATION   DATE OF CONSULTATION:  02/16/2013  CONSULTING PHYSICIAN:  Jasmine NancyShaukat A. Loreto Loescher, MD  INDICATION FOR THE CONSULTATION: Mildly elevated troponin, shortness of breath and possible CHF.  HISTORY OF PRESENT ILLNESS: This is a 74 year old white female with a past medical history of end-stage renal disease, hypertension and CVA, who presented to the Emergency Room with severe shortness of breath, cough productive of yellow sputum and was hypoxic, with saturation about 82%. She did miss dialysis because she was feeling sick.   PAST MEDICAL HISTORY: History of cholecystectomy, hysterectomy, history of fistula placement.   ALLERGIES: CODEINE, COREG, HYDRALAZINE, NORVASC, PENICILLIN AND VERAPAMIL.   CURRENT MEDICATIONS BEFORE HOSPITALIZATION: Nifedipine, losartan, labetalol and allopurinol along with Lasix.   REVIEW OF SYSTEMS: She did have some chest pain and shortness of breath while being admitted, but right now is feeling much better.   SOCIAL HISTORY: She quit smoking 30 years ago. Denies EtOH abuse.   FAMILY HISTORY: Positive for coronary artery disease.   PHYSICAL EXAMINATION:  GENERAL: She is alert, oriented x3, in no acute distress right now.  VITAL SIGNS: Her blood pressure is 100/64, respirations are 20, pulse is 88, saturation is 91.  NECK: No JVD.  LUNGS: Good air entry. A few rhonchi.  HEART: Regular rate and rhythm. Normal S1, S2. No audible murmur.  ABDOMEN: Soft, nontender, positive bowel sounds.  EXTREMITIES: No pedal edema.  NEUROLOGIC: She appears to be intact.   DIAGNOSTIC STUDIES: Her EKG shows normal sinus rhythm, 87 beats per minute, nonspecific ST-T changes. Phosphorus is 6.7. Troponin is 0.08. Her blood cultures showed no growth. Chest x-ray had bilateral pneumonia. Creatinine is 9.39, BUN is 55. Hemoglobin is low, is 8.3.   ASSESSMENT AND PLAN: The patient has possible  congestive heart failure/pneumonia bilaterally along with mildly elevated troponin, probably due to demand ischemia. Advise getting an echocardiogram and will make further recommendation based on that. Agree with giving blood transfusion as she is anemic, and will set her up as cardiology followup in the office along with primary care referral to Jasmine Murphy.   Thank you very much for referral.    ____________________________ Jasmine NancyShaukat A. Unique Sillas, MD sak:lb D: 02/16/2013 11:10:53 ET T: 02/16/2013 11:22:05 ET JOB#: [id number]  cc: Jasmine NancyShaukat A. Analyse Angst, MD, <Dictator> Jasmine NancySHAUKAT A Stepen Prins MD ELECTRONICALLY SIGNED 02/25/2013 8:38

## 2014-05-06 NOTE — H&P (Signed)
PATIENT NAME:  Jasmine, Murphy MR#:  [id number] DATE OF BIRTH:  1940-06-24  DATE OF ADMISSION:  02/15/2013  PRIMARY CARE PHYSICIAN: Dr. Mady Haagensen   REQUESTING PHYSICIAN: Dr. Jene Every.   CHIEF COMPLAINT: Shortness of breath.   HISTORY OF PRESENT ILLNESS: The patient is a 74 year old female with a known history of dialysis on Monday, Wednesday, Friday, hypertension, who is being admitted for acute respiratory failure, likely due to pneumonia. The patient fell about two weeks ago when she was here in the Emergency Department, as per patient. She was found to have some finger fractures on her left hand, for which she required brace. She was also found to have some rib injury on her left, although there were no fractures. The patient started having cough with brownish phlegm about three days ago, also reporting some low-grade fever and shortness of breath which was getting worse over the last three days associated with chest pain, mainly from constant coughing. She was feeling really worse, stated in her words, and "I was sick as a dog". She fell while trying to get in the bed and hit her head in the back, and with her worsening breathing she decided to come to the Emergency Department. She missed her dialysis due to her sickness yesterday. She came down to the Emergency Department, in the ED she was found to have hypoxia with room air oxygen saturations of 82% and was found to have bilateral pneumonia for which she is being admitted for further evaluation and management.   PAST MEDICAL HISTORY: End-stage renal disease on hemodialysis Monday, Wednesday, Friday. History of CVA.  Hypertension. Gout. History of migraine headache.   PAST SURGICAL HISTORY: 1. Cholecystectomy.  2. Hysterectomy.  3. Right AV fistula.  ALLERGIES: CODEINE. COREG, HYDRALAZINE, NORVASC, PENICILLIN AND VERAPAMIL.  FAMILY HISTORY: Positive for  hypertension.   SOCIAL HISTORY: She lives at home with her daughter. She is a  former smoker, about 34 pack-year. Quit about 10 years ago. No alcohol, no illicit drugs.   MEDICATIONS AT HOME: 1. Allopurinol 100 mg p.o. daily.  2. Citalopram 20 mg p.o. daily.  3. Dialyvite multivitamin once daily.  4. Lasix 80 mg p.o. daily.  5. Gabapentin 600 mg p.o. 3 times a day.  6. Labetalol 200 mg p.o. daily. 7. Levothyroxine  112 mcg p.o. daily.  8. Losartan 100 mg p.o. at bedtime.  9. Nifedipine 30 mg p.o. daily.  10. Omeprazole 40 mg p.o. daily.  11. Vitamin D3 2000 International units once daily.   REVIEW OF SYSTEMS:  CONSTITUTIONAL: Low-grade fever. Positive for fatigue and weakness.  EYES: No blurry or double vision.  ENT: No tinnitus or ear pain.  RESPIRATORY: Positive for cough with brownish sputum. Also chest pain and shortness of breath.  CARDIOVASCULAR: Chest pain, mainly from constant coughing, also likely from recent rib injury about 2 weeks ago. No orthopnea or edema.  GASTROINTESTINAL: She did have some nausea and vomiting yesterday, which has resolved. No diarrhea, abdominal pain.  GENITOURINARY: No dysuria or hematuria. She is a dialysis patient.  ENDOCRINE: No polyuria or nocturia.  HEMATOLOGY: Anemia of chronic disease. No easy bruising or bleeding.  SKIN: No rash or lesion. She does have a lump on her back of her head from head injury yesterday.  MUSCULOSKELETAL: No joint pain or arthralgia. No back pain.  NEUROLOGIC: No tingling, numbness. Positive for weakness.  PSYCHIATRIC: No history of anxiety or depression.   PHYSICAL EXAMINATION: VITAL SIGNS: Temperature 98.7, heart rate 86 per  minute, respirations 22 per minute, blood pressure 108/59 mmHg. She is saturating 82% on room air and 93% on 4 liters oxygen by nasal cannula.  IN GENERAL:  The patient is a 74 year old female lying in bed in acute respiratory distress.  EYES: Pupils equal, round and reactive to light and accommodation. No scleral icterus, extraocular muscles intact.  HEENT: Head  atraumatic, normocephalic. Oropharynx and nasopharynx clear.  NECK: Supple. No jugular venous distention. No thyroid enlargement or tenderness.  LUNGS: Decreased breath sounds at the bases. Rhonchi present at the bilateral bases. Accessory muscles of respiration used, some labored respiration.  CARDIOVASCULAR: S1 and S2 normal. No murmurs, rubs, or gallop. She does have some chest wall tenderness on the left on palpation.  ABDOMEN: Soft, nontender, nondistended. Bowel sounds present. No organomegaly or masses.  GASTROINTESTINAL: Soft, nontender, nondistended. Bowel sounds present. No organomegaly or masses.  EXTREMITIES: No peripheral edema, cyanosis or clubbing. She has a brace present on her left hand from recent finger fractures on left two fingers.  NEUROLOGIC: Cranial nerves III through XII intact. Muscle strength five out of five in all extremities. Sensation intact.  PSYCHIATRIC: The patient is alert and oriented x 3.  SKIN: She has a right upper extremity AV fistula. No obvious rash, lesion or ulcer.  MUSCULOSKELETAL:  No joint effusion or tenderness. She does have a fracture in brace present on her left hand. VASCULAR: She has a right upper extremity AV fistula.   LABORATORY PANEL: Normal BMP except BUN of 55, creatinine 9.39, sodium of 131.  Troponin of 0.08. CBC showed white count of 13.4, hemoglobin 8.3, hematocrit 25.7, platelets 258.   Chest x-ray in the ED showed bilateral infiltrates, primarily interstitial infiltrate. EKG shows normal sinus rhythm, no major ST-T changes.   IMPRESSION AND PLAN: 1. Acute respiratory failure with tachypnea, hypoxia, accessory muscles of respiration in use, likely secondary to pneumonia. She was hypoxic up to 82% on room air. We will place her oxygen, start IV antibiotic in the form of Levaquin. Obtain blood and sputum culture.  2. Pneumonia, likely secondary to unable to take deep breaths from rib injury about two weeks ago, status post fall. We will   start her on IV Levaquin. We will obtain sputum and blood culture.  3. Elevated troponin, likely due to supply/demand ischemia. Doubt myocardial infarction, serial troponins. We will admit her to telemetry.  4. End-stage renal disease, on hemodialysis: We will consult nephrology, missed hemodialysis yesterday, she may need one today. We will  discuss with nephrology.  5. Left hand fracture in the small finger and ring finger based on x-ray on 01/23. She already has a brace applied. We will need orthopedic follow-up as an outpatient.  6. Anemia of chronic disease. She is hemodynamically stable. We will monitor her closely her hemodynamics. No gross bleeding.  7. Ambulatory difficulties.  We will get physical therapy consultation and occupational therapy consultation as she has had a recurrent fall with several injuries. We will also get a CT scan of the head considering her head injury due to a fall yesterday.   CODE STATUS: FULL CODE.   TOTAL TIME TAKING CARE OF THIS PATIENT: 55 minutes (Critical care). She remains at high risk for cardiorespiratory compromise and arrest. We will monitor her closely.    ____________________________ Ellamae SiaVipul S. Sherryll BurgerShah, MD vss:sg D: 02/15/2013 11:58:00 ET T: 02/15/2013 12:27:14 ET JOB#: [id number]  cc: Lennox PippinsMunsoor N. Lateef, MD Analyah Mcconnon S. Sherryll BurgerShah, MD, <Dictator>   Patricia PesaVIPUL S Valene Villa MD ELECTRONICALLY SIGNED  02/16/2013 16:40 

## 2014-05-06 NOTE — H&P (Signed)
PATIENT NAME:  Jasmine Murphy, Jasmine Murphy MR#:  [id number] DATE OF BIRTH:  19-Aug-1940  DATE OF ADMISSION:  03/28/2013  REFERRING PHYSICIAN: Darien Ramus, MD  PRIMARY CARE PHYSICIAN: Lennox Pippins, MD   CHIEF COMPLAINT: Confusion.   HISTORY OF PRESENT ILLNESS: The patient is a 74 year old female with a recent admission for acute respiratory failure due to pneumonia in February, who was discharged to a rehab facility. The patient apparently was getting dialysis and finished the dialysis but staff found her to be hypoxic with sats in the 80s on room air and then also more confused than baseline. Per notes, she does have some confusion at baseline but now she was more confused. They brought her to the hospital. She has no significant fever or leukocytosis, but has a troponin of 0.14; however, at this point cannot provide much information reliably. She appears to be comfortable, denies any pain, denies shortness of breath. She knows she is in the hospital but states that she does not have any particular complaints. She did state that she did have a fall on Friday before dialysis but went to dialysis. She said she lives with her daughter. They did call a couple of numbers in the chart to contact her daughter but left a message on daughter's number, she did not pick up.   PAST MEDICAL HISTORY: End-stage renal disease on dialysis Monday, Wednesday, Fridays; history of stroke, hypertension, hypothyroidism, gout, history of migraine headaches, recent pneumonia.   SURGICAL HISTORY: Cholecystectomy, hysterectomy, right AV fistula graft.   ALLERGIES: CODEINE, COREG, HYDRALAZINE, NORVASC, PENICILLIN AND VERAPAMIL.   FAMILY HISTORY: Positive for hypertension per chart.   SOCIAL HISTORY: Unable to obtain but per chart lives with daughter. A former smoker, 34 pack-years, quit about 10 years ago. No alcohol or drugs. Possibly living at rehab facility now.   CURRENT MEDICATIONS: Allopurinol 100 mg daily, citalopram 20  mg daily, Dialyvite plus vitamins with mineral 1 tab daily, DuoNeb p.r.n. 3 mL 4 times a day as needed, furosemide 80 mg daily, gabapentin 300 mg 3 times a day, labetalol 200 mg once a day, levothyroxine 112 mcg daily, losartan 100 mg daily, nifedipine 60 mg extended-release 1/2 tab once a day, pantoprazole 40 mg daily, Renvela 800 mg 1 tab 3 times a day, Thera-M multivitamin with minerals in tab 1 tab once a day.   REVIEW OF SYSTEMS: Unable to obtain reliably as the patient does not have any particular complaints and she appears to be somewhat confused.   PHYSICAL EXAMINATION: VITAL SIGNS: Temperature on arrival 98, pulse rate 78, respiratory rate 22, blood pressure 182/84, O2 sat  94% on oxygen but per the chart saturating 80% on room air at the dialysis center.  GENERAL: The patient is a chronically ill-appearing female sitting in bed, talking in full sentences. No obvious distress.  HEENT: Normocephalic, atraumatic. Pupils are equal and reactive. Moist mucous membranes.  NECK: Supple. No thyroid tenderness. No cervical lymphadenopathy.  CARDIOVASCULAR: S1, S2 regular. No significant murmurs appreciated.  LUNGS: Diffuse bilateral mild crackles inspiratory-wise.  ABDOMEN: Soft, nontender, nondistended. Positive bowel sounds all quadrants.  EXTREMITIES: No significant pitting edema. The patient does have a dialysis access right upper extremity with good bruit. The patient does have scattered ecchymosis and some oozing from the left upper extremity where she states she had a fall.  NEUROLOGICALLY: The patient follows all directions. Strength appears to be 5 out of 5 in all extremities. Sensation is intact to light touch. Cranial nerves II through XII  to appears to be grossly intact.  PSYCHIATRIC: Awake, alert, oriented x 2. She knows she is in the hospital. She knows the year, does not know the date, month or day-wise. Can name the president but not her outside physician.   LABORATORY, DIAGNOSTIC AND  RADIOLOGICAL DATA:  Troponin 0.14. White count of 7.1, hemoglobin 9.8, platelets 207. ABG shows pH of 7.3, pCO2 of 66, pO2 82 on oxygen via nasal cannula. Glucose 79, BUN 14, creatinine 3.42, sodium 138, potassium is 3. EKG shows normal sinus rhythm, ST-T wave abnormalities, do see some new T wave inversions in inferior leads as compared to the previous EKG from last month, no acute ST elevations. X-ray of the chest shows bilateral stable infiltrates.    ASSESSMENT AND PLAN:  We have a 74 year old female with end-stage renal disease on dialysis, history of stroke, hypertension, gout, who was here for acute respiratory failure due to pneumonia last month, got discharged. She was brought in by EMS for acute hypoxic respiratory failure but also appears to have acute hypoxic/hypercapnic respiratory failure with some acidosis and hypercapnia. She does not have a fever or significant leukocytosis to suggest she is having a  pneumonia. She does not appear to be septic. I suspect she has acute diastolic congestive heart failure as she did have a normal echocardiogram recently with a possible fluid overload component. She is not wheezy but crackly. Given the hypercapnia and the confusion, I would start a trial of BiPAP and would repeat another ABG in a few hours. I have also placed a consult with nephrology for resumption of dialysis and perhaps she needs increased dialysis for fluid removal. I would obtain sputum cultures as well but would hold antibiotics for pneumonia. In regards to the confusion, I suspect she has metabolic encephalopathy, possibly from hypoxia and hypercapnic respiratory failure. Again, see what happens with the BiPAP. If she does not improve, would order a CT scan of the head. She appears to be neurologically intact, follows directions but is confused. I attempted to call the daughter to see what her baseline is but was not successful and left a message. The patient does have a positive troponin but  does not have any active chest pains per her, but she does not appear to be reliable but she appears calm, comfortable. There is some T wave inversions in inferior leads. I would cycle the troponins, start aspirin, resume the beta blocker she is on and obtain a cardiology consult. She did have a recent echocardiogram on February 4th showing normal ejection fraction. I would admit her to telemetry with a monitored bed and see what the troponins do. It could be demand ischemia from acute respiratory failure, would see what the next few sets of labs suggest. I would continue her blood pressure medications. She does appear to have anemia of chronic disease and hemoglobin is around her baseline. Per chart, the patient is full code.   TOTAL CRITICAL CARE TIME SPENT: 50 minutes.    ____________________________ Krystal EatonShayiq Jiraiya Mcewan, MD sa:cs D: 03/28/2013 16:45:20 ET T: 03/28/2013 18:58:51 ET JOB#: [id number]  cc: Krystal EatonShayiq Manvi Guilliams, MD, <Dictator> Munsoor Lizabeth LeydenN. Lateef, MD Krystal EatonSHAYIQ Jaymarie Yeakel MD ELECTRONICALLY SIGNED 04/14/2013 15:54

## 2014-05-06 NOTE — Consult Note (Signed)
PATIENT NAME:  Jasmine Murphy, Jasmine Murphy#:  [id number] DATE OF BIRTH:  [birth year]  DATE OF CONSULTATION:  03/28/2013  CONSULTING PHYSICIAN:  Jasmine NancyShaukat A. Debraann Livingstone, MD  INDICATION FOR CONSULTATION: Elevated troponin.  This is a 74 year old white female with a past medical history of hypertension, diabetes, hyperlipidemia, who presented to the Emergency Room because of shortness of breath and confusion. According to the daughter, the patient was in dialysis and became very confused and short of breath and was brought to the Emergency Room, where her troponin was mildly elevated; thus, I was asked to evaluate the patient. Her troponin was 0.14. Apparently, according to the nurses, she is feeling much better. She was not short of breath while being evaluated in the ER. She was not having any chest pain, and she seemed more and more alert. She was able to give me some history.   SOCIAL HISTORY: She denies EtOH abuse or smoking.  FAMILY HISTORY: Positive for coronary artery disease.   ALLERGIES: She is not allergic to any medications.  PAST MEDICAL HISTORY: As mentioned, she has a history of gout, history of hypertension, history of hyperlipidemia, history of end-stage renal disease (getting dialysis 3 times a week).  MEDICATIONS: Aspirin, allopurinol, labetalol 200 daily, losartan 100 mg daily, nifedipine 30 mg daily, Protonix 40 mg daily.   PHYSICAL EXAMINATION:   GENERAL: She is alert, oriented x 2, in no acute distress. VITAL SIGNS: Stable. NECK: No JVD. LUNGS: Good air entry. No rales or rhonchi. HEART: Regular rate, rhythm. No audible murmur. ABDOMEN: Soft, nontender. Positive bowel sounds. EXTREMITIES: No pedal edema. 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  LABORATORY AND DIAGNOSTIC DATA: On EKG, she was normal sinus rhythm, 79 beats per minute. ST and T changes with ST depressions in the inferior  leads, as well as ST depression and T wave inversions in the lateral leads. Troponin, as mentioned, was 0.14. Glucose was 79. BUN was 14. Creatinine was 3.4.  ASSESSMENT AND PLAN: Mildly elevated troponin, most likely secondary to end-stage renal disease. She denies any chest pain or shortness of breath. Advise monitoring on telemetry and doing cardiac enzymes q.8 hours x 3 and see if enzymes go up, along with troponin. However, in the meantime, will get an echocardiogram to evaluate her wall motion and look at her ejection fraction. No need to do stress testing or cardiac cath at this time.   Thank you very much for the referral.    ____________________________ Jasmine NancyShaukat A. Jacobs Golab, MD sak:jcm D: 03/28/2013 17:39:48 ET T: 03/28/2013 18:27:43 ET JOB#: [id number]  cc: Jasmine NancyShaukat A. Ty Buntrock, MD, <Dictator> Jasmine NancySHAUKAT A Madysun Thall MD ELECTRONICALLY SIGNED 04/27/2013 13:56

## 2014-05-06 NOTE — Discharge Summary (Signed)
PATIENT NAME:  Jasmine Murphy, Jasmine Murphy MR#:  [id number] DATE OF BIRTH:  [birth year]  DATE OF ADMISSION:  02/15/2013 DATE OF DISCHARGE:  02/19/2013   PRESENTING COMPLAINT: Shortness of breath.   DISCHARGE DIAGNOSES:  1. Acute hypoxic respiratory failure secondary to pneumonia, left-sided, improving.  2. Symptomatic anemia, appears to be acute on chronic, likely secondary to end-stage renal disease, status post 1 unit blood transfusion.  3. Elevated troponin, likely demand ischemia secondary to pneumonia and symptomatic anemia.  4. End-stage renal disease, on hemodialysis Monday, Wednesday, Friday.  5. Hypertension.  6. Hypothyroidism.   CODE STATUS: Full code.   CONDITION ON DISCHARGE: Fair.   DIET: Renal diet. A 2 gram sodium diet.   DISCHARGE INSTRUCTIONS: 1. Continue oxygen 2 liters per minute nasal cannula continuous. Wean if the patient tolerates and saturations greater than 92%.  2. DuoNebs q.4 hourly as needed.   FOLLOWUP:  1. With Dr. Martha ClanKrasinski in 7 to 10 days. The patient is supposed to wear her left hand splint as instructed.  2. Physical therapy.   LABORATORY DATA AT DISCHARGE:  White count is 10.7, H and H is 8.7 and 26.1, platelet count 226.  Phosphorus is 3.8.  Chest x-ray consistent with bilateral perihilar infiltrates, with left more than right, consistent with pneumonia and pulmonary edema.  Magnesium 1.9.  Troponin 0.08, 0.11.   MEDICATIONS AT DISCHARGE:  1. Tylenol 650 q.4 p.r.n.  2. Docusate 100 mg b.i.d. p.r.n.  3. Senokot 1 tablet b.i.d. p.r.n.  4. Losartan 100 mg p.o. daily.  5. Neurontin 600 mg t.i.d.  6. Celexa 20 mg daily.  7. Labetalol 200 mg p.o. daily.  8. Lasix 80 mg p.o. daily.  9. Nifedipine ER 30 mg daily.  10. Protonix 40 mg daily.  11. Synthroid 0.112 mg p.o. daily.  12. Cholecalciferol 2000 units daily.  13. Multivitamin with minerals p.o. daily.  14. Allopurinol 100 mg every other day.  15. Levaquin 500 mg q.48 hours, 4 more doses.    CONSULTATIONS:  1. Cardiology consultation with Dr. Adrian BlackwaterShaukat Khan.  2. Renal consultation with Dr. Cherylann RatelLateef.   BRIEF SUMMARY OF HOSPITAL COURSE: Jasmine BridegroomLinda Murphy is a 74 year old Caucasian female with past medical history of end-stage renal disease, on hemodialysis, history of chronic anemia, migraine headaches, hypothyroidism, CVA, who comes to the Emergency Room after she started having shortness of breath for the last couple days. She was admitted with:   1. Acute hypoxic respiratory failure secondary to bilateral pulmonary vascular congestion/edema along with left-sided pneumonia. She was started on broad-spectrum antibiotics with vancomycin, meropenem and Levaquin, which was switched to p.o. Levaquin to finish up a 7- to 10-day course. The patient remained afebrile. Her white count was normal prior to discharge. She does have history of smoking in the past, likely has underlying chronic obstructive pulmonary disease. The p.r.n. nebulizers were continued. She will require oxygen for now 2 liters nasal cannula continuous, which can be weaned off at the facility as tolerated as long as the patient's saturations remains greater than 92%.  2. Symptomatic anemia, appears to be acute on chronic, secondary to end-stage renal disease. No melena or any black or bloody stools. No hemoptysis. The patient received 1 unit of blood transfusion when her hemoglobin went down to 6.8. Prior to discharge. it is 8.4.  3. Elevated troponin, likely due to demand ischemia from above. No history of myocardial infarction in the past. The patient was seen by Dr. Welton FlakesKhan, recommends to follow up with him as outpatient.  4. End-stage renal disease, on hemodialysis. Dr. Cherylann Ratel saw the patient, and her dialysis was continued.  5. Hypertension. Blood pressure medications were resumed.  6. Hypothyroidism, on Synthroid.  7. Physical therapy saw the patient and recommended rehab. Discussed with the patient's daughter, Jasmine Murphy, and the  patient as well, and they are in agreement. She will be discharged to a skilled facility today.  8. Hospital stay otherwise remained stable.   CODE STATUS: The patient remained a full code.   TIME SPENT: 40 minutes.    ____________________________ Wylie Hail Allena Katz, MD sap:lb D: 02/19/2013 11:20:18 ET T: 02/19/2013 12:09:07 ET JOB#: [id number]  cc: Heath Badon A. Allena Katz, MD, <Dictator> Kathreen Devoid, MD Laurier Nancy, MD Munsoor Lizabeth Leyden, MD  Willow Ora MD ELECTRONICALLY SIGNED 02/24/2013 13:18

## 2014-05-14 NOTE — Consult Note (Signed)
   Comments   I met with pt's daughter. Updated her on pt's condition. Daughter understands that pt is critically ill and will likely not survive this illness. I explained that Dr Mortimer Fries feels pt would need intubation if she had bronchoscopy and daughter does not want intubation. Daughter wants comfort care. She feels that pt's husband would agree. Pt now DNR.  pt is still married but has not been in contact with her husband in ~ 70yr. We have made multiple attempts to contact husband including [facility] the [facility] [place]. We have also tried listed phone numbers. Daughter has also tried reaching husband. I feel that we should act in pt's best interest based on daughter's wishes and transition to comfort care. Orders entered.   Electronic Signatures: Taegan Standage, NIzora Gala(MD)  (Signed 21-Jan-16 15:54)  Authored: Palliative Care   Last Updated: 21-Jan-16 15:54 by Emojean Gertz, NIzora Gala(MD)

## 2014-05-14 NOTE — Discharge Summary (Signed)
PATIENT NAME:  Jasmine SantiagoSMITH, Feiga F MR#:  [id number] DATE OF BIRTH:  06/25/1940  DATE OF ADMISSION:  02/02/2014 DATE OF DISCHARGE:  November 26, 2014  DISPOSITION:  To hospice home.   DISCHARGE DIAGNOSES:  1. End-stage renal disease.  2. Left lung collapse.  3. Dementia.   DISCHARGE MEDICATIONS: Zofran 4 mg every 6 hours as needed for nausea and vomiting, Ativan 0.5 mg 1 to 2 tablets sublingually 2 to 4 hours as needed for anxiety, morphine 0.25 mg  every 1 to 2 hours.   CODE STATUS: DNR.   CONSULTATIONS: Dr. Harvie JuniorPhifer and [place] from hospice home.   HOSPITAL COURSE: A 74 year old female patient with history of ESRD on hemodialysis, dementia, hypertension, hypothyroidism, (dementia  comes in because of altered mental status. The patient is from [facility]. The patient was found to have collapse of the left lung secondary to a mucus plug and UTI. The patient's oxygen saturations were 70% on room air. The patient initially was admitted to the CCU because of acute hypoxic respiratory failure due to mucus plug and left lung collapse. The patient was started on IV vancomycin, meropenem and azithromycin, and seen by palliative care in the Emergency Room. The patient remained unresponsive, unable to give a review of systems. The patient was made comfort care after discussing with the daughter. The patient was initially admitted to the CCU, but she was transferred to 1C for comfort care. The patient was maintained on morphine and Ativan, and we stopped all antibiotics. She was continued on comfort care until yesterday on January 25, and the patient went to hospice home on January 25.  DISCHARGE TIME SPENT ON THIS PATIENT: More than 30 minutes.      ____________________________ Katha HammingSnehalatha Sinai Mahany, MD sk:JT D: 02/07/2014 08:07:57 ET T: 02/07/2014 12:55:05 ET JOB#: [id number]  cc: Katha HammingSnehalatha Dustin Bumbaugh, MD, <Dictator> Katha HammingSNEHALATHA Sarp Vernier MD ELECTRONICALLY SIGNED 02/14/2014 13:49

## 2014-05-14 NOTE — H&P (Signed)
PATIENT NAME:  Jasmine Murphy, Jasmine Murphy#:  [id number] DATE OF BIRTH:  11/18/1940  DATE OF ADMISSION:  02/02/2014  ADMITTING PHYSICIAN: Enid Baasadhika Carroll Ranney, MD   PRIMARY CARE MEDICAL DOCTOR: [facility] at [facility].   PRIMARY NEPHROLOGIST: Mosetta PigeonHarmeet Singh, MD  Mosetta PigeonHarmeet Singh, MD  CHIEF COMPLAINT: Adopted mental status.   HISTORY OF PRESENT ILLNESS: Ms. Jasmine Murphy is a 74 year old Caucasian female with past medical history significant for end-stage renal disease on Monday, Wednesday, Friday hemodialysis' history of CVA, wheelchair bedbound status at baseline; COPD; hypertension; recurrent UTIs; restless leg syndrome, who presents to the hospital from [facility] secondary to altered mental status. The patient is pretty much obtunded currently and most of the history is obtained from daughter at bedside. According to daughter, the patient has been a resident at [facility] for almost a year now. She has a history of dementia. She is conversational and able to recognize family, but she has had multiple falls and last year, she was discharged to [facility] nursing home. Prior to that, the patient was living at home with her daughter. Daughter last saw her on Sunday. The patient was at her baseline. On Tuesday, which is 2 days ago from now, the patient was sleeping more, according to the nursing home staff, and did not want to get out of bed, take her medicines. This morning, they found her completely unresponsive and brought her to the Emergency Room. She was noted to be hypoxic with saturations of 74% on room air. The patient does not use home oxygen. Her chest x-ray shows whiteout of her left lung, possibly mucus plug blocking. The patient also has significant urinary tract infection. She does not have any fever, other than a low-grade temperature of 99.4 and hypoxia, so she is being admitted for sepsis and left lung collapse.   PAST MEDICAL HISTORY: 1. Dementia.  2. End-stage renal disease on Monday,  Wednesday, Friday hemodialysis.  3. CVA. 4. COPD. 5. Hypertension.  6. Hypothyroidism.  7. Gout.  8. Migraines.   PAST SURGICAL HISTORY: 1. Cholecystectomy.  2. Hysterectomy.  3. Right AV fistula.   ALLERGIES TO MEDICATIONS: THE PATIENT IS ALLERGIC TO CODEINE, COREG, HYDRALAZINE, NORVASC, PENICILLIN, AND VERAPAMIL.  CURRENT HOME MEDICATIONS:  1. Allopurinol 100 mg 1/2 tablet daily.  2. Gabapentin 300 mg p.o. b.i.d.  3. Guaifenesin 100 mg/5 mL liquid 10 mL orally q. 6 hours. p.r.n. for cough.  4. Levothyroxine 150 mcg p.o. daily.  5. Metolazone 2.5 mg p.o. daily.  6. Mucinex DM 600 mg tablet b.i.d.  7. Multivitamin 1 tablet p.o. daily.  8. Nifedipine 60 mg p.o. daily.  9. Protonix 40 mg p.o. daily.  10. Quetiapine 75 mg p.o. in the evening.  11. Rena-Vite vitamin B complex with folic acid tablet 1 tablet daily.  12. Renvela 800 mg p.o. t.i.d.  13. Simethicone 125 mg p.o. t.i.d.    SOCIAL HISTORY: Has been a resident at [facility], wheelchair bound currently. Remote history of smoking. No alcohol use. Used to live at home with daughter before going to [facility]. Daughter is the only living child that the patient has, and her name is Ms. Jasmine Murphy. She is at bedside.   FAMILY HISTORY: Not obtainable.   REVIEW OF SYSTEMS: Not obtainable secondary to the patient's obtunded mental status.   PHYSICAL EXAMINATION: VITAL SIGNS: Temperature 99.4 degrees Fahrenheit, pulse 85 respirations 16, blood pressure 208/85, pulse oximetry 74% on room air.  GENERAL: Thin built, well-nourished female lying in  bed, not in any acute distress.  HEENT: Normocephalic, atraumatic. Pupils are equal, round, reacting to light. Anicteric sclerae. Oropharynx with dry mucous membranes. No erythema, mass, or exudates.  NECK: Supple. No thyromegaly, JVD, or carotid bruits. No lymphadenopathy.  LUNGS: She has course rhonchi bilaterally. Decreased breath sounds on the left side. No use of  accessory muscles for breathing. No wheezing.  CARDIOVASCULAR: S1, S2, regular rate and rhythm. A 3/6 systolic murmur heard. No rubs or gallops.  ABDOMEN: Soft, nontender, nondistended. No hepatosplenomegaly. Normal bowel sounds.  EXTREMITIES: No pedal edema. No clubbing or cyanosis. Feeble dorsalis pedis pulses palpable bilaterally.  SKIN: No acne, rash, or lesions.  LYMPHATICS: Cervical lymphadenopathy.  NEUROLOGIC: The patient is obtunded, unable to do a complete neuro exam. She is withdrawing to pain. She has involuntary jerks noted of her lower extremities, which are chronic, but more frequent, according to the daughter, now.  PSYCHOLOGICAL: The patient is obtunded.   LABORATORY DATA: WBC 8.9, hemoglobin 11.9, hematocrit 38.0, platelet count 212. Sodium 141, potassium 5.7, chloride 103, bicarbonate 28, BUN 60 creatinine 8.09, glucose 95, calcium of 8.9. ALT 25, AST 46, alkaline phosphatase 117, total bilirubin 0.4, albumin of 3.1. Lipase is 111, magnesium is 2.3. Troponin 0.06. Urinalysis with 2+ leukocyte esterase, 2+ bacteria, 14,000 WBCs noted. Chest x-ray showing complete prolapse of the left lung, possible left main bronchus, mucous plug, right lung is clear. No change in cardiac silhouette. CT of the head without contrast: Showing atrophy, chronic small vessel ischemic changes, old right thalamic infarct. No acute intracranial abnormalities. EKG: Showing sinus rhythm with PVCs, a heart rate of 97, no acute ST or T-wave abnormalities.   ASSESSMENT AND PLAN: This is a 74 year old female with a history of hypertension, endstage renal disease on hemodialysis, dementia, restless leg syndrome, cerebral vascular accident, wheelchair-bound at baseline, from [facility] for altered mental status and hypoxia.  1. Acute hypoxic respiratory failure secondary to left lung collapse from mucous plug. Pulmonary has been consulted, likely will need bronchoscopy. Initial labs were 74% on room air, now  improved up to 97% on 4 liters. We will cover with empiric antibiotics, vancomycin, meropenem, and azithromycin. The patient is limited code with no intubation.  2. Sepsis. May be urinary tract infection and pneumonia. Blood and urine cultures have been sent. Put on broad-spectrum antibiotics at this time.  3. Metabolic encephalopathy. Obtunded mental status, has known dementia at baseline, conversational at baseline. Likely obtunded from sepsis. Monitor while on antibiotics.  4. End-stage renal disease. Nephrology has been consulted. Monday, Wednesday, Friday hemodialysis.  5. Hypertension. IV hydralazine p.r.n. while obtunded,  while meds are held.  6. Hypothyroidism. Synthroid changed to IV.  7. Deep vein thrombosis prophylaxis.  8. Daughter is at bedside, confirms limited code, with palliative care consult.   TOTAL CRITICAL CARE TIME SPENT ON THIS PATIENT: 60 minutes.   ____________________________ Enid Baas, MD rk:mw D: 02/02/2014 13:04:06 ET T: 02/02/2014 13:45:46 ET JOB#: [id number]  cc: Enid Baas, MD, <Dictator> Mosetta Pigeon, MD Enid Baas MD ELECTRONICALLY SIGNED 02/05/2014 14:09

## 2016-06-07 IMAGING — CT CT CHEST W/O CM
2 of 3 series · 15 of 36 positions shown, 18 images · non-contrast
Comparison: Chest radiograph 02/02/2014

CLINICAL DATA: Abnormal chest radiograph, difficulty breathing,
altered mental status, 74% oxygen saturation on room air

EXAM:
CT CHEST WITHOUT CONTRAST
TECHNIQUE: Multidetector CT imaging of the chest was performed following the
standard protocol without IV contrast. Sagittal and coronal MPR
images reconstructed from axial data set.

[Series 2: routine chest wo · axial · 0.68mm/px · z∈[-512,-232]mm · 12 of 66 slices shown, 15 images]
[im 5/66  mediastinal]
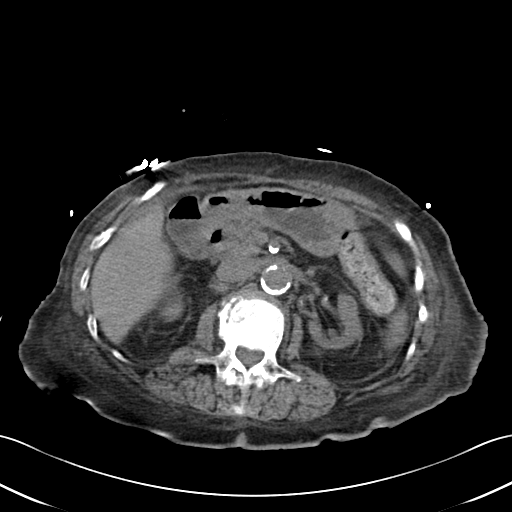
[im 5/66  lung]
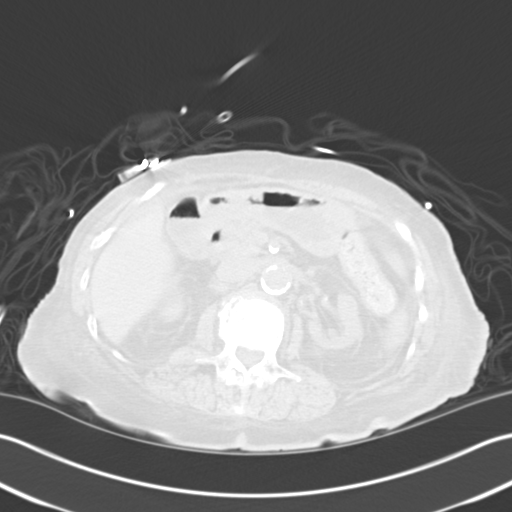
[im 10/66  lung]
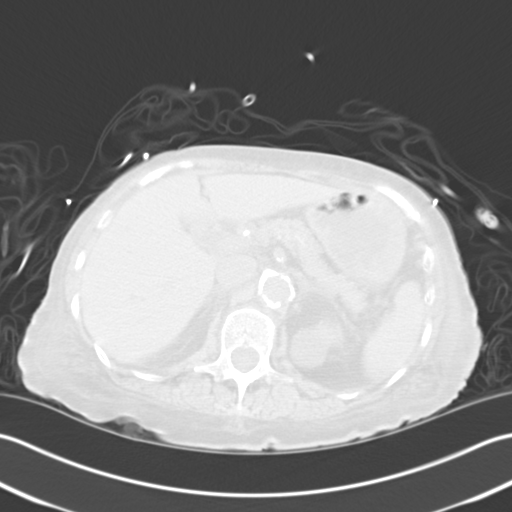
[im 15/66  lung]
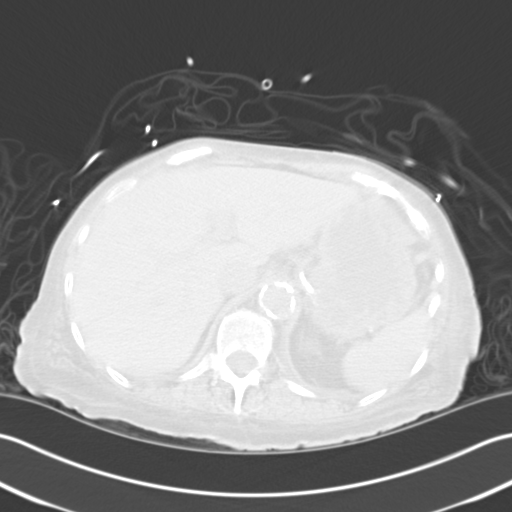
[im 20/66  lung]
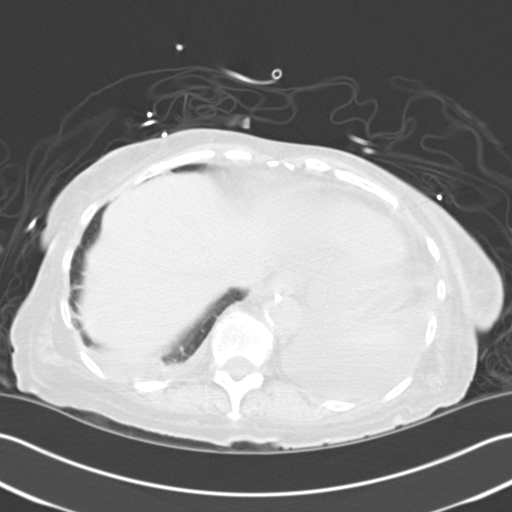
[im 25/66  mediastinal]
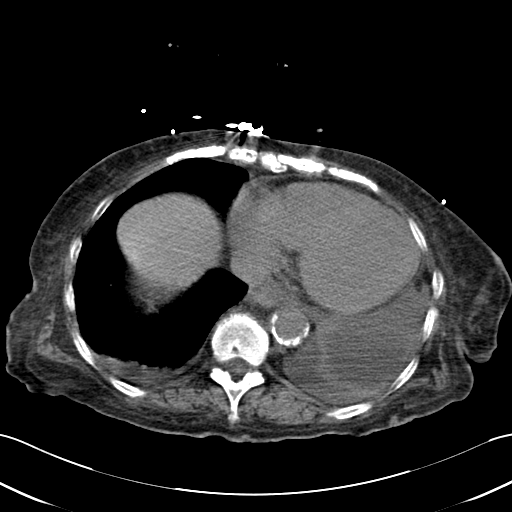
[im 25/66  lung]
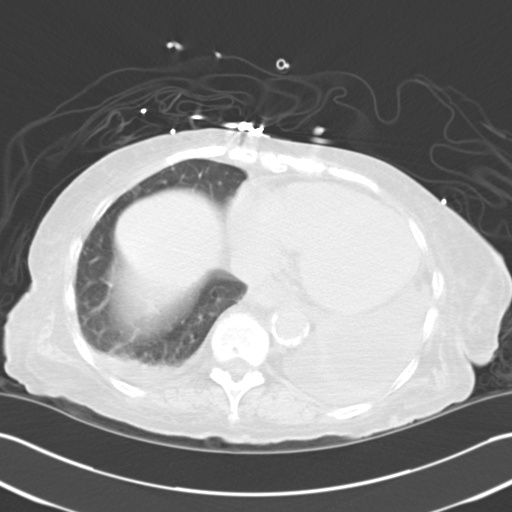
[im 29/66  lung]
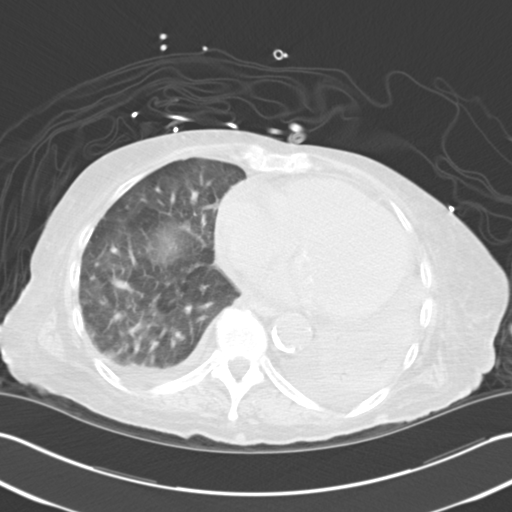
[im 37/66  lung]
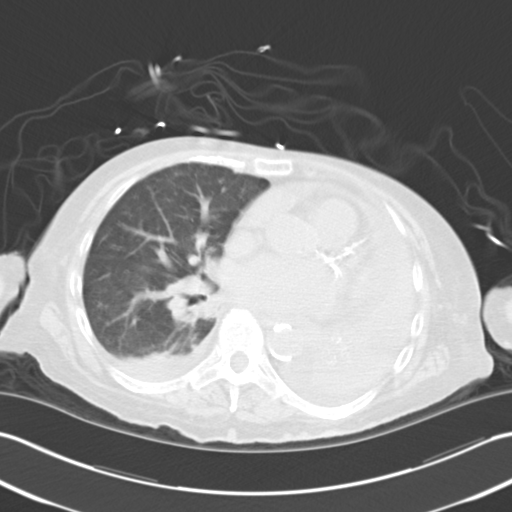
[im 41/66  lung]
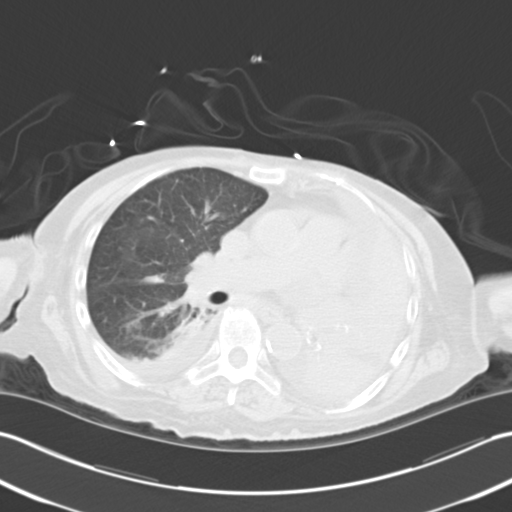
[im 46/66  mediastinal]
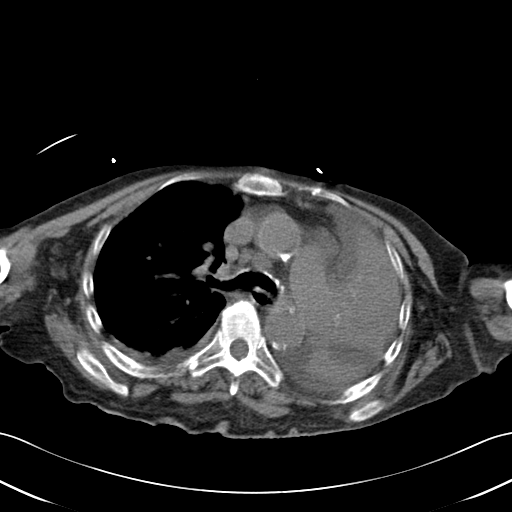
[im 46/66  lung]
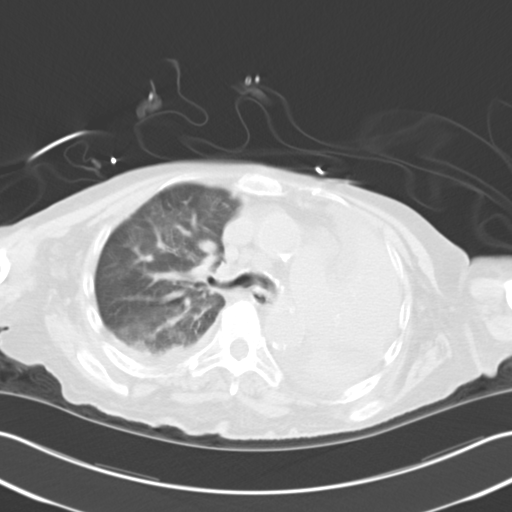
[im 51/66  lung]
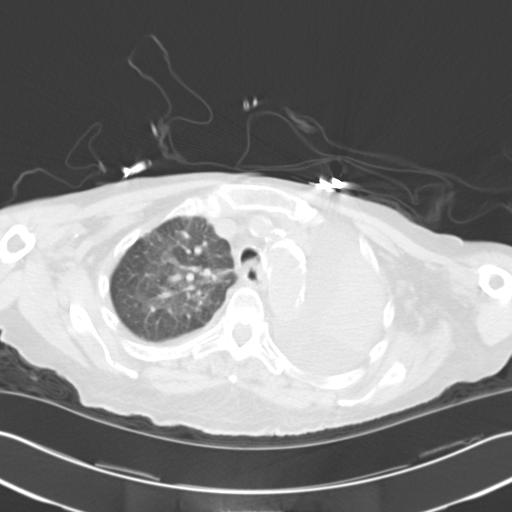
[im 56/66  lung]
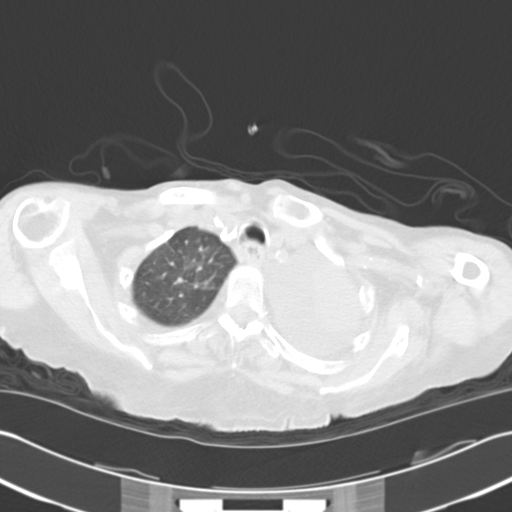
[im 61/66  lung]
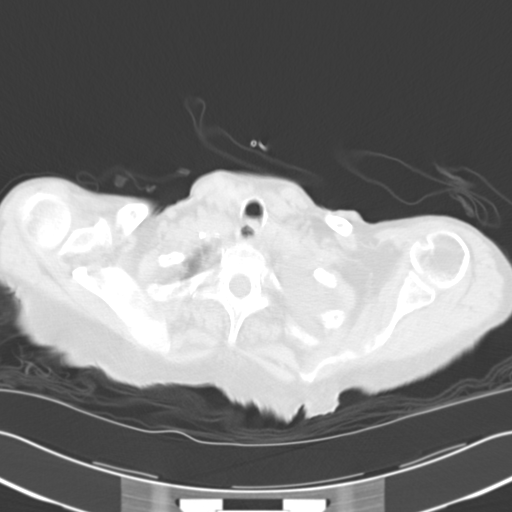

[Series 5: cor routine chest wo · coronal · 0.70mm/px · 3 of 114 slices shown]
[im 23/114  lung]
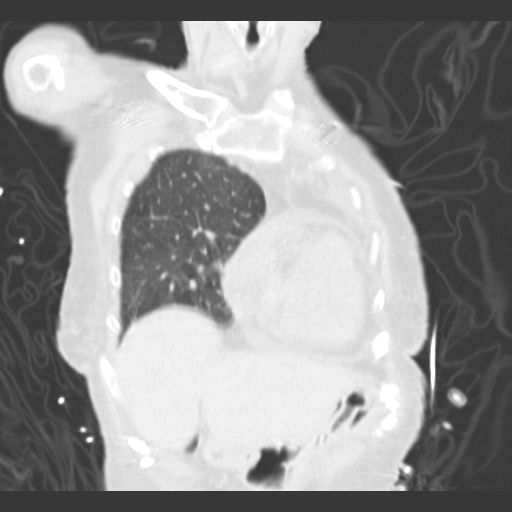
[im 46/114  lung]
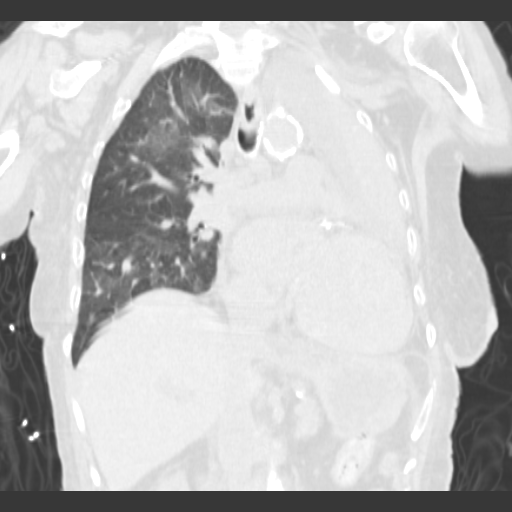
[im 68/114  lung]
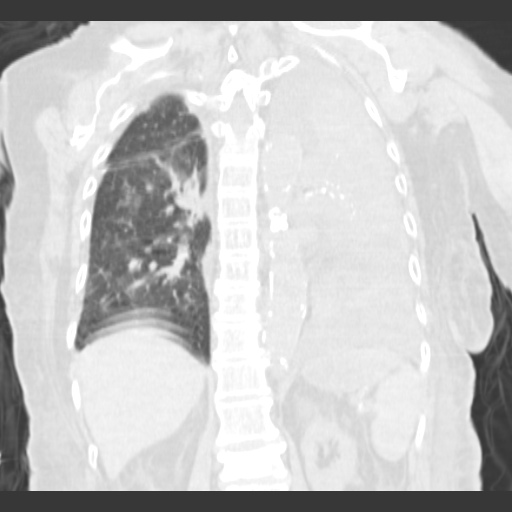

[15 of 36 positions shown; findings below may reference images not displayed]

FINDINGS: Extensive atherosclerotic calcifications aorta and coronary
arteries.

Minimal pericardial fluid.

Kidneys appear atrophic with small cyst upper pole of the LEFT
kidney 3.0 x 2.9 cm image 55.

Small to moderate LEFT pleural effusion.

Complete collapse of the LEFT lung with air identified within the
LEFT mainstem bronchus and LEFT lower lobe bronchus, remaining
bronchi air-filled.

Scattered motion artifacts.

Patchy infiltrates RIGHT lung especially RIGHT upper lobe.

Small RIGHT pleural effusion and compressive atelectasis of RIGHT
lower lobe.

Small stippled calcifications are identified centrally in the LEFT
lung in the perihilar region, potentially bronchial cartilage
calcification extending into the lower lobe.

No LEFT hilar mass or adenopathy visualized.

Upper normal sized precarinal lymph nodes.

No additional thoracic adenopathy.

Bones diffusely demineralized with marked compression deformity of a
vertebra at the thoracolumbar junction new since 02/16/2013.
IMPRESSION: Small to moderate LEFT pleural effusion with complete collapse of
LEFT lung and opacification of airways be on the LEFT mainstem and
proximal LEFT lower lobe bronchi.

No definite obstructing mass lesion or adenopathy of hilum no
assessment is limited by the lack of IV contrast.

Small RIGHT pleural effusion, compressive atelectasis of RIGHT lower
lobe and patchy airspace infiltrate in RIGHT upper lobe.

Extensive atherosclerotic calcification.

LEFT renal cysts.

Compression fracture of a vertebra at the thoracolumbar junction new
since 02/16/2013.

## 2020-08-31 ENCOUNTER — Telehealth: Payer: Self-pay | Admitting: Cardiology

## 2020-08-31 NOTE — Telephone Encounter (Signed)
Pt c/o medication issue:  1. Name of Medication: pralent  2. How are you currently taking this medication (dosage and times per day)? Not currently taking   3. Are you having a reaction (difficulty breathing--STAT)? No  4. What is your medication issue? Lucy from [employer] is calling to to let the doctor know that she needs a stand alone prescription with the dxc attached for the assistance program
